# Patient Record
Sex: Male | Born: 1955 | Hispanic: Yes | Marital: Married | Smoking: Never smoker
Health system: Southern US, Community
[De-identification: ages and names within clinical notes are randomized; demographics above are authoritative.]

## PROBLEM LIST (undated history)

## (undated) DIAGNOSIS — E785 Hyperlipidemia, unspecified: Secondary | ICD-10-CM

## (undated) DIAGNOSIS — Z973 Presence of spectacles and contact lenses: Secondary | ICD-10-CM

## (undated) DIAGNOSIS — E119 Type 2 diabetes mellitus without complications: Secondary | ICD-10-CM

## (undated) HISTORY — DX: Hyperlipidemia, unspecified: E78.5

## (undated) HISTORY — PX: WISDOM TOOTH EXTRACTION: SHX21

## (undated) HISTORY — DX: Type 2 diabetes mellitus without complications: E11.9

---

## 1998-04-20 HISTORY — PX: MOUTH SURGERY: SHX715

## 2000-10-20 ENCOUNTER — Inpatient Hospital Stay (HOSPITAL_COMMUNITY): Admission: EM | Admit: 2000-10-20 | Discharge: 2000-10-21 | Payer: Self-pay | Admitting: Emergency Medicine

## 2000-10-20 ENCOUNTER — Encounter: Payer: Self-pay | Admitting: Emergency Medicine

## 2014-07-04 ENCOUNTER — Encounter (HOSPITAL_BASED_OUTPATIENT_CLINIC_OR_DEPARTMENT_OTHER): Payer: Self-pay | Admitting: *Deleted

## 2014-07-04 NOTE — Progress Notes (Signed)
No PCP-no cardiac or resp problems

## 2014-07-08 ENCOUNTER — Encounter (HOSPITAL_BASED_OUTPATIENT_CLINIC_OR_DEPARTMENT_OTHER): Payer: Self-pay | Admitting: Surgery

## 2014-07-08 ENCOUNTER — Ambulatory Visit: Payer: Self-pay | Admitting: Surgery

## 2014-07-08 DIAGNOSIS — K436 Other and unspecified ventral hernia with obstruction, without gangrene: Secondary | ICD-10-CM | POA: Diagnosis present

## 2014-07-08 NOTE — H&P (Signed)
General Surgery [facility]  Gary EinsteinLouis Stewart 06/27/2014 8:54 AM Location: [facility] Patient #: [id number] DOB: [birth year] Married / Language: English / Race: White Male  History of Present Illness Gary Stewart(Gary Stewart M. Gary Arora MD; 06/27/2014 9:22 AM) Patient words: hernia.  The patient is a 59 year old male who presents with an umbilical hernia. Patient is referred by Dr. Salli RealYun Stewart for evaluation of epigastric hernia. Patient first noted a bulge just above the level of the umbilicus approximately 1 month ago. Patient denies any pain. The bulge has become slightly larger. He denies any signs or symptoms of intestinal obstruction. He has had no prior abdominal surgery. He has never had a hernia repair. Patient discussed this with his friend, Dr. Colonel BaldGary Stewart, who recommended surgical repair. Patient presents today for evaluation.   Other Problems Fay Records(Gary Stewart, CMA; 06/27/2014 8:54 AM) Cancer  Past Surgical History Fay Records([place]; 06/27/2014 8:54 AM) Oral Surgery Vasectomy  Diagnostic Studies History Fay Records([place]; 06/27/2014 8:54 AM) Colonoscopy never  Allergies Fay Records(Gary Stewart, CMA; 06/27/2014 8:55 AM) Penicillin G Procaine *PENICILLINS*  Medication History Fay Records(Gary Stewart, CMA; 06/27/2014 8:55 AM) No Current Medications Medications Reconciled  Social History Fay Records(Gary Stewart, CMA; 06/27/2014 8:54 AM) Alcohol use Occasional alcohol use. Caffeine use Carbonated beverages, Coffee, Tea. No drug use Tobacco use Never smoker.  Family History Fay Records([place]; 06/27/2014 8:54 AM) Diabetes Mellitus Father.  Review of Systems Fay Records(Gary Stewart CMA; 06/27/2014 8:54 AM) General Not Present- Appetite Loss, Chills, Fatigue, Fever, Night Sweats, Weight Gain and Weight Loss. Skin Not Present- Change in Wart/Mole, Dryness, Hives, Jaundice, New Lesions, Non-Healing Wounds, Rash and Ulcer. HEENT Present- Wears glasses/contact lenses. Not Present- Earache, Hearing Loss, Hoarseness,  Nose Bleed, Oral Ulcers, Ringing in the Ears, Seasonal Allergies, Sinus Pain, Sore Throat, Visual Disturbances and Yellow Eyes. Respiratory Not Present- Bloody sputum, Chronic Cough, Difficulty Breathing, Snoring and Wheezing. Breast Not Present- Breast Mass, Breast Pain, Nipple Discharge and Skin Changes. Cardiovascular Not Present- Chest Pain, Difficulty Breathing Lying Down, Leg Cramps, Palpitations, Rapid Heart Rate, Shortness of Breath and Swelling of Extremities. Gastrointestinal Not Present- Abdominal Pain, Bloating, Bloody Stool, Change in Bowel Habits, Chronic diarrhea, Constipation, Difficulty Swallowing, Excessive gas, Gets full quickly at meals, Hemorrhoids, Indigestion, Nausea, Rectal Pain and Vomiting. Male Genitourinary Not Present- Blood in Urine, Change in Urinary Stream, Frequency, Impotence, Nocturia, Painful Urination, Urgency and Urine Leakage. Musculoskeletal Not Present- Back Pain, Joint Pain, Joint Stiffness, Muscle Pain, Muscle Weakness and Swelling of Extremities. Neurological Not Present- Decreased Memory, Fainting, Headaches, Numbness, Seizures, Tingling, Tremor, Trouble walking and Weakness. Psychiatric Not Present- Anxiety, Bipolar, Change in Sleep Pattern, Depression, Fearful and Frequent crying. Endocrine Not Present- Cold Intolerance, Excessive Hunger, Hair Changes, Heat Intolerance, Hot flashes and New Diabetes. Hematology Not Present- Easy Bruising, Excessive bleeding, Gland problems, HIV and Persistent Infections.   Vitals Fay Records(Gary Stewart CMA; 06/27/2014 8:56 AM) 06/27/2014 8:55 AM Weight: 203 lb Height: 72in Body Surface Area: 2.16 m Body Mass Index: 27.53 kg/m Temp.: 96.27F(Temporal)  Pulse: 85 (Regular)  Resp.: 17 (Unlabored)  BP: 132/68 (Sitting, Left Arm, Standard)    Physical Exam Gary Stewart(Gary Stewart M. Gary Peckham MD; 06/27/2014 9:22 AM)  General - appears comfortable, no distress; not diaphorectic  HEENT - normocephalic; sclerae clear, gaze conjugate; mucous  membranes moist, dentition good; voice normal  Neck - symmetric on extension; no palpable anterior or posterior cervical adenopathy; no palpable masses in the thyroid bed  Chest - clear bilaterally with rhonchi, rales, or wheeze  Cor - regular rhythm  with normal rate; no significant murmur  Abd - soft without distension; visible bulge both in the standing and recumbent position just above the level of the umbilicus; umbilicus appears normal; bulge measures approximately 5-6 cm in diameter, is partially reducible, is mildly tender with palpation; it is difficult to assess the size of the fascial defect but I believe it is 2 cm or less  Ext - non-tender without significant edema or lymphedema  Neuro - grossly intact; no tremor    Assessment & Plan Gary Heckler MD; 06/27/2014 9:24 AM)  EPIGASTRIC HERNIA (553.29  K43.9)  Patient has an incarcerated epigastric hernia. This is relatively asymptomatic.  Patient denied discuss surgical repair. I have recommended open repair with mesh patch. I have provided him with written literature to review at home. We have discussed performing the procedure as an outpatient surgical operation. We discussed restrictions on his activities following the surgery. We discussed the risk of recurrence being less than 5%. Patient understands and wishes to proceed in the near future. We will make arrangements for surgery at a time convenient for the patient.  The risks and benefits of the procedure have been discussed at length with the patient. The patient understands the proposed procedure, potential alternative treatments, and the course of recovery to be expected. All of the patient's questions have been answered at this time. The patient wishes to proceed with surgery.  Gary Heckler, MD, [facility] Office: [phone]

## 2014-07-10 ENCOUNTER — Ambulatory Visit (HOSPITAL_BASED_OUTPATIENT_CLINIC_OR_DEPARTMENT_OTHER)
Admission: RE | Admit: 2014-07-10 | Discharge: 2014-07-10 | Disposition: A | Discharge status: Home / Self Care | Payer: BLUE CROSS/BLUE SHIELD | Source: Ambulatory Visit | Attending: Surgery | Admitting: Surgery

## 2014-07-10 ENCOUNTER — Ambulatory Visit (HOSPITAL_BASED_OUTPATIENT_CLINIC_OR_DEPARTMENT_OTHER): Payer: BLUE CROSS/BLUE SHIELD | Admitting: Certified Registered"

## 2014-07-10 ENCOUNTER — Encounter (HOSPITAL_BASED_OUTPATIENT_CLINIC_OR_DEPARTMENT_OTHER)
Admission: RE | Disposition: A | Discharge status: Home / Self Care | Payer: Self-pay | Source: Ambulatory Visit | Attending: Surgery

## 2014-07-10 ENCOUNTER — Encounter (HOSPITAL_BASED_OUTPATIENT_CLINIC_OR_DEPARTMENT_OTHER): Payer: Self-pay | Admitting: *Deleted

## 2014-07-10 DIAGNOSIS — F1099 Alcohol use, unspecified with unspecified alcohol-induced disorder: Secondary | ICD-10-CM | POA: Insufficient documentation

## 2014-07-10 DIAGNOSIS — K436 Other and unspecified ventral hernia with obstruction, without gangrene: Secondary | ICD-10-CM | POA: Diagnosis not present

## 2014-07-10 DIAGNOSIS — Z880 Allergy status to penicillin: Secondary | ICD-10-CM | POA: Insufficient documentation

## 2014-07-10 HISTORY — DX: Presence of spectacles and contact lenses: Z97.3

## 2014-07-10 HISTORY — PX: EPIGASTRIC HERNIA REPAIR: SHX404

## 2014-07-10 LAB — POCT HEMOGLOBIN-HEMACUE: Hemoglobin: 16.6 g/dL (ref 13.0–17.0)

## 2014-07-10 SURGERY — REPAIR, HERNIA, EPIGASTRIC, ADULT
Anesthesia: General | Site: Abdomen

## 2014-07-10 MED ORDER — FENTANYL CITRATE 0.05 MG/ML IJ SOLN
INTRAMUSCULAR | Status: DC | PRN
  Administered 2014-07-10: 50 ug via INTRAVENOUS
  Administered 2014-07-10: 100 ug via INTRAVENOUS

## 2014-07-10 MED ORDER — SUCCINYLCHOLINE CHLORIDE 20 MG/ML IJ SOLN
INTRAMUSCULAR | Status: AC
  Filled 2014-07-10: qty 1

## 2014-07-10 MED ORDER — OXYCODONE HCL 5 MG/5ML PO SOLN: 5.0000 mg | Freq: Once | ORAL | Status: AC | PRN

## 2014-07-10 MED ORDER — LIDOCAINE HCL (CARDIAC) 20 MG/ML IV SOLN
INTRAVENOUS | Status: DC | PRN
  Administered 2014-07-10: 80 mg via INTRAVENOUS

## 2014-07-10 MED ORDER — OXYCODONE HCL 5 MG PO TABS
5.0000 mg | ORAL_TABLET | Freq: Once | ORAL | Status: AC | PRN
  Administered 2014-07-10: 5 mg via ORAL

## 2014-07-10 MED ORDER — DEXAMETHASONE SODIUM PHOSPHATE 4 MG/ML IJ SOLN
INTRAMUSCULAR | Status: DC | PRN
  Administered 2014-07-10: 10 mg via INTRAVENOUS

## 2014-07-10 MED ORDER — FENTANYL CITRATE 0.05 MG/ML IJ SOLN
INTRAMUSCULAR | Status: AC
  Filled 2014-07-10: qty 6

## 2014-07-10 MED ORDER — LACTATED RINGERS IV SOLN
INTRAVENOUS | Status: DC
  Administered 2014-07-10 (×2): via INTRAVENOUS

## 2014-07-10 MED ORDER — EPHEDRINE SULFATE 50 MG/ML IJ SOLN
INTRAMUSCULAR | Status: DC | PRN
  Administered 2014-07-10 (×2): 5 mg via INTRAVENOUS

## 2014-07-10 MED ORDER — BUPIVACAINE HCL (PF) 0.5 % IJ SOLN
INTRAMUSCULAR | Status: AC
  Filled 2014-07-10: qty 30

## 2014-07-10 MED ORDER — BUPIVACAINE HCL (PF) 0.5 % IJ SOLN
INTRAMUSCULAR | Status: DC | PRN
  Administered 2014-07-10: 30 mL

## 2014-07-10 MED ORDER — PROPOFOL 10 MG/ML IV EMUL
INTRAVENOUS | Status: AC
  Filled 2014-07-10: qty 50

## 2014-07-10 MED ORDER — PROPOFOL 10 MG/ML IV BOLUS
INTRAVENOUS | Status: DC | PRN
  Administered 2014-07-10: 300 mg via INTRAVENOUS

## 2014-07-10 MED ORDER — FENTANYL CITRATE 0.05 MG/ML IJ SOLN: 50.0000 ug | INTRAMUSCULAR | Status: DC | PRN

## 2014-07-10 MED ORDER — CIPROFLOXACIN IN D5W 400 MG/200ML IV SOLN
INTRAVENOUS | Status: AC
  Filled 2014-07-10: qty 200

## 2014-07-10 MED ORDER — MIDAZOLAM HCL 2 MG/2ML IJ SOLN
INTRAMUSCULAR | Status: AC
  Filled 2014-07-10: qty 2

## 2014-07-10 MED ORDER — MIDAZOLAM HCL 2 MG/2ML IJ SOLN: 1.0000 mg | INTRAMUSCULAR | Status: DC | PRN

## 2014-07-10 MED ORDER — CIPROFLOXACIN IN D5W 400 MG/200ML IV SOLN
400.0000 mg | INTRAVENOUS | Status: AC
  Administered 2014-07-10: 400 mg via INTRAVENOUS

## 2014-07-10 MED ORDER — MIDAZOLAM HCL 5 MG/5ML IJ SOLN
INTRAMUSCULAR | Status: DC | PRN
  Administered 2014-07-10: 2 mg via INTRAVENOUS

## 2014-07-10 MED ORDER — HYDROMORPHONE HCL 1 MG/ML IJ SOLN: 0.2500 mg | INTRAMUSCULAR | Status: DC | PRN

## 2014-07-10 MED ORDER — OXYCODONE HCL 5 MG PO TABS
ORAL_TABLET | ORAL | Status: AC
  Filled 2014-07-10: qty 1

## 2014-07-10 MED ORDER — OXYCODONE HCL 5 MG PO TABS: 5.0000 mg | ORAL_TABLET | ORAL | Status: DC | PRN

## 2014-07-10 MED ORDER — ONDANSETRON HCL 4 MG/2ML IJ SOLN
INTRAMUSCULAR | Status: DC | PRN
Start: 2014-07-10 — End: 2014-07-10
  Administered 2014-07-10: 4 mg via INTRAVENOUS

## 2014-07-10 MED ORDER — ONDANSETRON HCL 4 MG/2ML IJ SOLN: 4.0000 mg | Freq: Once | INTRAMUSCULAR | Status: DC | PRN

## 2014-07-10 SURGICAL SUPPLY — 37 items
BENZOIN TINCTURE PRP APPL 2/3 (GAUZE/BANDAGES/DRESSINGS) ×2 IMPLANT
BLADE CLIPPER SURG (BLADE) IMPLANT
BLADE SURG 15 STRL LF DISP TIS (BLADE) ×1 IMPLANT
BLADE SURG 15 STRL SS (BLADE) ×1
CHLORAPREP W/TINT 26ML (MISCELLANEOUS) ×2 IMPLANT
COTTONBALL LRG STERILE PKG (GAUZE/BANDAGES/DRESSINGS) ×2 IMPLANT
COVER BACK TABLE 60X90IN (DRAPES) ×2 IMPLANT
COVER MAYO STAND STRL (DRAPES) ×2 IMPLANT
DECANTER SPIKE VIAL GLASS SM (MISCELLANEOUS) ×2 IMPLANT
DRAPE LAPAROTOMY 100X72 PEDS (DRAPES) ×2 IMPLANT
DRAPE UTILITY XL STRL (DRAPES) ×2 IMPLANT
ELECT REM PT RETURN 9FT ADLT (ELECTROSURGICAL) ×2
ELECTRODE REM PT RTRN 9FT ADLT (ELECTROSURGICAL) ×1 IMPLANT
GAUZE SPONGE 4X4 16PLY XRAY LF (GAUZE/BANDAGES/DRESSINGS) IMPLANT
GLOVE BIO SURGEON STRL SZ 6.5 (GLOVE) ×2 IMPLANT
GLOVE BIOGEL PI IND STRL 7.0 (GLOVE) ×2 IMPLANT
GLOVE BIOGEL PI INDICATOR 7.0 (GLOVE) ×2
GLOVE SURG ORTHO 8.0 STRL STRW (GLOVE) ×2 IMPLANT
GOWN STRL REUS W/ TWL LRG LVL3 (GOWN DISPOSABLE) ×1 IMPLANT
GOWN STRL REUS W/ TWL XL LVL3 (GOWN DISPOSABLE) ×1 IMPLANT
GOWN STRL REUS W/TWL LRG LVL3 (GOWN DISPOSABLE) ×1
GOWN STRL REUS W/TWL XL LVL3 (GOWN DISPOSABLE) ×1
MESH VENTRALEX ST 2.5 CRC MED (Mesh General) ×2 IMPLANT
NEEDLE HYPO 25X1 1.5 SAFETY (NEEDLE) ×2 IMPLANT
NS IRRIG 1000ML POUR BTL (IV SOLUTION) ×2 IMPLANT
PACK BASIN DAY SURGERY FS (CUSTOM PROCEDURE TRAY) ×2 IMPLANT
PENCIL BUTTON HOLSTER BLD 10FT (ELECTRODE) ×2 IMPLANT
SLEEVE SCD COMPRESS KNEE MED (MISCELLANEOUS) ×2 IMPLANT
STRIP CLOSURE SKIN 1/2X4 (GAUZE/BANDAGES/DRESSINGS) ×2 IMPLANT
SUT ETHIBOND 0 MO6 C/R (SUTURE) IMPLANT
SUT MNCRL AB 4-0 PS2 18 (SUTURE) ×2 IMPLANT
SUT VICRYL 3-0 CR8 SH (SUTURE) ×2 IMPLANT
SUT VICRYL 4-0 PS2 18IN ABS (SUTURE) IMPLANT
SYR BULB 3OZ (MISCELLANEOUS) IMPLANT
SYR CONTROL 10ML LL (SYRINGE) ×2 IMPLANT
TOWEL OR 17X24 6PK STRL BLUE (TOWEL DISPOSABLE) ×4 IMPLANT
TOWEL OR NON WOVEN STRL DISP B (DISPOSABLE) ×2 IMPLANT

## 2014-07-10 NOTE — Op Note (Signed)
NAMKarlene Stewart:  Blackwell, Phillips              ACCOUNT NO.:  [id number]  MEDICAL RECORD NO.:  [id number]  LOCATION:                                 FACILITY:  PHYSICIAN:  [name] [name], MD           DATE OF BIRTH:  DATE OF PROCEDURE:  07/10/2014                              OPERATIVE REPORT   PREOPERATIVE DIAGNOSIS:  Incarcerated epigastric hernia.  POSTOPERATIVE DIAGNOSIS:  Incarcerated epigastric hernia.  PROCEDURE:  Repair of incarcerated epigastric hernia with mesh patch.  SURGEON:  [name] [name], MD, FACS  ANESTHESIA:  General.  ESTIMATED BLOOD LOSS:  Minimal.  PREPARATION:  ChloraPrep.  COMPLICATIONS:  None.  INDICATIONS:  The patient is a 59 year old male with an incarcerated epigastric hernia.  This was just above the level of the umbilicus in the midline.  It is causing mild discomfort.  He has had no signs or symptoms of obstruction.  He now comes to Surgery for repair.  BODY OF REPORT:  Pro[facility] .  The patient was brought to the operating room, placed in supine position on the operating room table.  Following administration of general anesthesia, the patient was prepped and draped in the usual aseptic fashion.  After ascertaining that an adequate level of anesthesia have been achieved, a midline incision was made just above the level of the umbilicus.  Dissection was carried in the subcutaneous tissues.  A fatty tissue mass was identified.  It was dissected out.  It measures approximately 5-6 cm in diameter.  It was dissected out of the subcutaneous tissues down to the fascial plane.  Fascial defect measures approximately 3 cm in diameter.  The mass was freed circumferentially and reduced.  Preperitoneal space was developed bluntly.  A Bard Ventralex ST mesh patch measuring 6.4 cm in diameter was selected and prepared.  It was inserted into the preperitoneal space and deployed circumferentially.  It was secured  circumferentially to the fascial edges with interrupted 0 Ethibond sutures.  Local field block was placed with Marcaine.  Subcutaneous tissues were closed with interrupted 3-0 Vicryl sutures.  Skin was closed with a running 4- 0 Monocryl subcuticular suture.  Skin was anesthetized with local anesthetic.  Wound was dressed with topical Dermabond.  The patient was awakened from anesthesia and brought to the recovery room.  The patient tolerated the procedure well.   [name], MD, [facility] Office: [phone]    TMG/MEDQ  D:  07/10/2014  T:  07/10/2014  Job:  [id number]  cc:   Dr. Charyl DancerYun

## 2014-07-10 NOTE — Anesthesia Procedure Notes (Signed)
Procedure Name: LMA Insertion Date/Time: 07/10/2014 7:21 AM Performed by: Curly ShoresRAFT, Cadence Minton W Pre-anesthesia Checklist: Patient identified, Emergency Drugs available, Suction available and Patient being monitored Patient Re-evaluated:Patient Re-evaluated prior to inductionOxygen Delivery Method: Circle System Utilized Preoxygenation: Pre-oxygenation with 100% oxygen Intubation Type: IV induction Ventilation: Mask ventilation without difficulty LMA: LMA inserted LMA Size: 5.0 Number of attempts: 1 Airway Equipment and Method: Bite block Placement Confirmation: positive ETCO2 and breath sounds checked- equal and bilateral Tube secured with: Tape Dental Injury: Teeth and Oropharynx as per pre-operative assessment

## 2014-07-10 NOTE — Discharge Instructions (Signed)

## 2014-07-10 NOTE — Anesthesia Preprocedure Evaluation (Signed)
Anesthesia Evaluation  Patient identified by MRN, date of birth, ID band Patient awake    Reviewed: Allergy & Precautions, NPO status , Patient's Chart, lab work & pertinent test results  Airway Mallampati: I  TM Distance: >3 FB Neck ROM: Full    Dental  (+) Teeth Intact, Partial Lower, Dental Advisory Given   Pulmonary  breath sounds clear to auscultation        Cardiovascular Rhythm:Regular Rate:Normal     Neuro/Psych    GI/Hepatic   Endo/Other    Renal/GU      Musculoskeletal   Abdominal   Peds  Hematology   Anesthesia Other Findings   Reproductive/Obstetrics                             Anesthesia Physical Anesthesia Plan  ASA: I  Anesthesia Plan: General   Post-op Pain Management:    Induction: Intravenous  Airway Management Planned: LMA  Additional Equipment:   Intra-op Plan:   Post-operative Plan: Extubation in OR  Informed Consent: I have reviewed the patients History and Physical, chart, labs and discussed the procedure including the risks, benefits and alternatives for the proposed anesthesia with the patient or authorized representative who has indicated his/her understanding and acceptance.   Dental advisory given  Plan Discussed with: CRNA, Anesthesiologist and Surgeon  Anesthesia Plan Comments:         Anesthesia Quick Evaluation

## 2014-07-10 NOTE — Anesthesia Postprocedure Evaluation (Signed)
  Anesthesia Post-op Note  Patient: Gary Stewart  Procedure(s) Performed: Procedure(s): INCARCERATED EPIGASTRIC HERNIA REPAIR WITH MESH (N/A)  Patient Location: PACU  Anesthesia Type: General   Level of Consciousness: awake, alert  and oriented  Airway and Oxygen Therapy: Patient Spontanous Breathing  Post-op Pain: none  Post-op Assessment: Post-op Vital signs reviewed  Post-op Vital Signs: Reviewed  Last Vitals:  Filed Vitals:   07/10/14 0921  BP: 141/81  Pulse: 71  Temp: 36.2 C  Resp: 20    Complications: No apparent anesthesia complications

## 2014-07-10 NOTE — Interval H&P Note (Signed)
History and Physical Interval Note:  07/10/2014 7:10 AM  Karlene EinsteinLouis Goodwine  has presented today for surgery, with the diagnosis of INCARCERATED EPIGASTRIC HERNIA.   The various methods of treatment have been discussed with the patient and family. After consideration of risks, benefits and other options for treatment, the patient has consented to    Procedure(s): INCARCERATED EPIGASTRIC HERNIA REPAIR WITH MESH (N/A) as a surgical intervention .    The patient's history has been reviewed, patient examined, no change in status, stable for surgery.  I have reviewed the patient's chart and labs.  Questions were answered to the patient's satisfaction.    [name]. [place] Noori, MD, [facility] Office: [phone]    Ember Henrikson MJudie Petit

## 2014-07-10 NOTE — Brief Op Note (Signed)
07/10/2014  8:13 AM  PATIENT:  Gary Stewart  59 y.o. male  PRE-OPERATIVE DIAGNOSIS:  INCARCERATED EPIGASTRIC HERNIA   POST-OPERATIVE DIAGNOSIS:  incarcerated epigastric hernia  PROCEDURE:  Procedure(s): INCARCERATED EPIGASTRIC HERNIA REPAIR WITH MESH (N/A)  SURGEON:  Surgeon(s) and Role:    * Darnell Levelodd Anel Creighton, MD - Primary  ANESTHESIA:   general  EBL:  Total I/O In: 1300 [I.V.:1300] Out: -   BLOOD ADMINISTERED:none  DRAINS: none   LOCAL MEDICATIONS USED:  MARCAINE     SPECIMEN:  No Specimen  DISPOSITION OF SPECIMEN:  N/A  COUNTS:  YES  TOURNIQUET:  * No tourniquets in log *  DICTATION: .Other Dictation: Dictation Number [id number]  PLAN OF CARE: Discharge to home after PACU  PATIENT DISPOSITION:  PACU - hemodynamically stable.   Delay start of Pharmacological VTE agent (>24hrs) due to surgical blood loss or risk of bleeding: yes  [name]. Neiva Maenza, MD, [facility] Office: [phone]

## 2014-07-10 NOTE — Transfer of Care (Signed)
Immediate Anesthesia Transfer of Care Note  Patient: Gary EinsteinLouis Stewart  Procedure(s) Performed: Procedure(s): INCARCERATED EPIGASTRIC HERNIA REPAIR WITH MESH (N/A)  Patient Location: PACU  Anesthesia Type:General  Level of Consciousness: awake, sedated and responds to stimulation  Airway & Oxygen Therapy: Patient Spontanous Breathing and Patient connected to face mask oxygen  Post-op Assessment: Report given to RN, Post -op Vital signs reviewed and stable and Patient moving all extremities  Post vital signs: Reviewed and stable  Last Vitals:  Filed Vitals:   07/10/14 0627  BP: 156/91  Pulse: 79  Temp: 36.7 C  Resp: 16    Complications: No apparent anesthesia complications

## 2014-07-11 ENCOUNTER — Encounter (HOSPITAL_BASED_OUTPATIENT_CLINIC_OR_DEPARTMENT_OTHER): Payer: Self-pay | Admitting: Surgery

## 2018-01-14 DIAGNOSIS — Z23 Encounter for immunization: Secondary | ICD-10-CM | POA: Diagnosis not present

## 2019-04-25 ENCOUNTER — Inpatient Hospital Stay (HOSPITAL_COMMUNITY)
Admission: EM | Admit: 2019-04-25 | Discharge: 2019-04-27 | DRG: 639 | Disposition: A | Discharge status: Home / Self Care | Payer: BC Managed Care – PPO | Attending: Family Medicine | Admitting: Family Medicine

## 2019-04-25 ENCOUNTER — Encounter (HOSPITAL_COMMUNITY): Payer: Self-pay

## 2019-04-25 DIAGNOSIS — Z6820 Body mass index (BMI) 20.0-20.9, adult: Secondary | ICD-10-CM | POA: Diagnosis not present

## 2019-04-25 DIAGNOSIS — R634 Abnormal weight loss: Secondary | ICD-10-CM | POA: Diagnosis not present

## 2019-04-25 DIAGNOSIS — Z85831 Personal history of malignant neoplasm of soft tissue: Secondary | ICD-10-CM | POA: Diagnosis not present

## 2019-04-25 DIAGNOSIS — E1110 Type 2 diabetes mellitus with ketoacidosis without coma: Principal | ICD-10-CM | POA: Diagnosis present

## 2019-04-25 DIAGNOSIS — Z820 Family history of epilepsy and other diseases of the nervous system: Secondary | ICD-10-CM | POA: Diagnosis not present

## 2019-04-25 DIAGNOSIS — Z20822 Contact with and (suspected) exposure to covid-19: Secondary | ICD-10-CM | POA: Diagnosis not present

## 2019-04-25 DIAGNOSIS — R358 Other polyuria: Secondary | ICD-10-CM | POA: Diagnosis not present

## 2019-04-25 DIAGNOSIS — K5730 Diverticulosis of large intestine without perforation or abscess without bleeding: Secondary | ICD-10-CM | POA: Diagnosis not present

## 2019-04-25 DIAGNOSIS — E1165 Type 2 diabetes mellitus with hyperglycemia: Secondary | ICD-10-CM

## 2019-04-25 DIAGNOSIS — M21372 Foot drop, left foot: Secondary | ICD-10-CM

## 2019-04-25 DIAGNOSIS — E1100 Type 2 diabetes mellitus with hyperosmolarity without nonketotic hyperglycemic-hyperosmolar coma (NKHHC): Secondary | ICD-10-CM

## 2019-04-25 DIAGNOSIS — Z880 Allergy status to penicillin: Secondary | ICD-10-CM

## 2019-04-25 DIAGNOSIS — Z03818 Encounter for observation for suspected exposure to other biological agents ruled out: Secondary | ICD-10-CM | POA: Diagnosis not present

## 2019-04-25 DIAGNOSIS — M7989 Other specified soft tissue disorders: Secondary | ICD-10-CM | POA: Diagnosis present

## 2019-04-25 DIAGNOSIS — R432 Parageusia: Secondary | ICD-10-CM | POA: Diagnosis not present

## 2019-04-25 DIAGNOSIS — R739 Hyperglycemia, unspecified: Secondary | ICD-10-CM | POA: Diagnosis not present

## 2019-04-25 LAB — BASIC METABOLIC PANEL
Anion gap: 20 — ABNORMAL HIGH (ref 5–15)
BUN: 17 mg/dL (ref 8–23)
CO2: 19 mmol/L — ABNORMAL LOW (ref 22–32)
Calcium: 9.1 mg/dL (ref 8.9–10.3)
Chloride: 79 mmol/L — ABNORMAL LOW (ref 98–111)
Creatinine, Ser: 1.21 mg/dL (ref 0.61–1.24)
GFR calc Af Amer: >60 mL/min (ref >60)
GFR calc non Af Amer: >60 mL/min (ref >60)
Glucose, Bld: 1076 mg/dL (ref 70–99)
Potassium: 5 mmol/L (ref 3.5–5.1)
Sodium: 118 mmol/L — CL (ref 135–145)

## 2019-04-25 LAB — URINALYSIS, ROUTINE W REFLEX MICROSCOPIC
Bacteria, UA: NONE SEEN
Bilirubin Urine: NEGATIVE
Glucose, UA: >=500 mg/dL — AB
Hgb urine dipstick: NEGATIVE
Ketones, ur: 20 mg/dL — AB
Leukocytes,Ua: NEGATIVE
Nitrite: NEGATIVE
Protein, ur: NEGATIVE mg/dL
Specific Gravity, Urine: 1.025 (ref 1.005–1.030)
pH: 5 (ref 5.0–8.0)

## 2019-04-25 LAB — CBG MONITORING, ED: Glucose-Capillary: >600 mg/dL (ref 70–99)

## 2019-04-25 LAB — CBC
HCT: 40.2 % (ref 39.0–52.0)
Hemoglobin: 13.5 g/dL (ref 13.0–17.0)
MCH: 32.9 pg (ref 26.0–34.0)
MCHC: 33.6 g/dL (ref 30.0–36.0)
MCV: 98 fL (ref 80.0–100.0)
Platelets: 225 10*3/uL (ref 150–400)
RBC: 4.1 MIL/uL — ABNORMAL LOW (ref 4.22–5.81)
RDW: 12.3 % (ref 11.5–15.5)
WBC: 7.5 10*3/uL (ref 4.0–10.5)
nRBC: 0 % (ref 0.0–0.2)

## 2019-04-25 NOTE — ED Triage Notes (Signed)
Pt sent here by doctor for abnormal lab work, hyperglycemia in DKA

## 2019-04-25 NOTE — ED Notes (Signed)
CBG Results of HI >600 reported to [place], Charity fundraiser.

## 2019-04-26 ENCOUNTER — Inpatient Hospital Stay (HOSPITAL_COMMUNITY): Payer: BC Managed Care – PPO

## 2019-04-26 ENCOUNTER — Encounter (HOSPITAL_COMMUNITY): Payer: Self-pay | Admitting: Internal Medicine

## 2019-04-26 DIAGNOSIS — R634 Abnormal weight loss: Secondary | ICD-10-CM | POA: Diagnosis present

## 2019-04-26 DIAGNOSIS — E1165 Type 2 diabetes mellitus with hyperglycemia: Secondary | ICD-10-CM | POA: Diagnosis not present

## 2019-04-26 DIAGNOSIS — Z85831 Personal history of malignant neoplasm of soft tissue: Secondary | ICD-10-CM | POA: Diagnosis not present

## 2019-04-26 DIAGNOSIS — E1110 Type 2 diabetes mellitus with ketoacidosis without coma: Secondary | ICD-10-CM | POA: Diagnosis present

## 2019-04-26 DIAGNOSIS — Z6820 Body mass index (BMI) 20.0-20.9, adult: Secondary | ICD-10-CM | POA: Diagnosis not present

## 2019-04-26 DIAGNOSIS — Z880 Allergy status to penicillin: Secondary | ICD-10-CM | POA: Diagnosis not present

## 2019-04-26 DIAGNOSIS — E1100 Type 2 diabetes mellitus with hyperosmolarity without nonketotic hyperglycemic-hyperosmolar coma (NKHHC): Secondary | ICD-10-CM | POA: Diagnosis not present

## 2019-04-26 DIAGNOSIS — M21372 Foot drop, left foot: Secondary | ICD-10-CM | POA: Diagnosis present

## 2019-04-26 DIAGNOSIS — Z820 Family history of epilepsy and other diseases of the nervous system: Secondary | ICD-10-CM | POA: Diagnosis not present

## 2019-04-26 DIAGNOSIS — Z20822 Contact with and (suspected) exposure to covid-19: Secondary | ICD-10-CM | POA: Diagnosis present

## 2019-04-26 DIAGNOSIS — M7989 Other specified soft tissue disorders: Secondary | ICD-10-CM | POA: Diagnosis present

## 2019-04-26 LAB — HEPATIC FUNCTION PANEL
ALT: 32 U/L (ref 0–44)
AST: 20 U/L (ref 15–41)
Albumin: 2.8 g/dL — ABNORMAL LOW (ref 3.5–5.0)
Alkaline Phosphatase: 49 U/L (ref 38–126)
Bilirubin, Direct: 0.1 mg/dL (ref 0.0–0.2)
Indirect Bilirubin: 0.9 mg/dL (ref 0.3–0.9)
Total Bilirubin: 1 mg/dL (ref 0.3–1.2)
Total Protein: 5.2 g/dL — ABNORMAL LOW (ref 6.5–8.1)

## 2019-04-26 LAB — POCT I-STAT EG7
Acid-base deficit: 3 mmol/L — ABNORMAL HIGH (ref 0.0–2.0)
Bicarbonate: 21.6 mmol/L (ref 20.0–28.0)
Calcium, Ion: 1.11 mmol/L — ABNORMAL LOW (ref 1.15–1.40)
HCT: 46 % (ref 39.0–52.0)
Hemoglobin: 15.6 g/dL (ref 13.0–17.0)
O2 Saturation: 73 %
Potassium: 4.7 mmol/L (ref 3.5–5.1)
Sodium: 126 mmol/L — ABNORMAL LOW (ref 135–145)
TCO2: 23 mmol/L (ref 22–32)
pCO2, Ven: 36.3 mmHg — ABNORMAL LOW (ref 44.0–60.0)
pH, Ven: 7.381 (ref 7.250–7.430)
pO2, Ven: 39 mmHg (ref 32.0–45.0)

## 2019-04-26 LAB — BETA-HYDROXYBUTYRIC ACID
Beta-Hydroxybutyric Acid: 6.78 mmol/L — ABNORMAL HIGH (ref 0.05–0.27)
Beta-Hydroxybutyric Acid: 1.52 mmol/L — ABNORMAL HIGH (ref 0.05–0.27)
Beta-Hydroxybutyric Acid: 0.86 mmol/L — ABNORMAL HIGH (ref 0.05–0.27)

## 2019-04-26 LAB — BASIC METABOLIC PANEL
Anion gap: 24 — ABNORMAL HIGH (ref 5–15)
Anion gap: 15 (ref 5–15)
Anion gap: 12 (ref 5–15)
Anion gap: 11 (ref 5–15)
Anion gap: 10 (ref 5–15)
BUN: 16 mg/dL (ref 8–23)
BUN: 10 mg/dL (ref 8–23)
BUN: 9 mg/dL (ref 8–23)
BUN: 8 mg/dL (ref 8–23)
BUN: 9 mg/dL (ref 8–23)
CO2: 18 mmol/L — ABNORMAL LOW (ref 22–32)
CO2: 21 mmol/L — ABNORMAL LOW (ref 22–32)
CO2: 25 mmol/L (ref 22–32)
CO2: 25 mmol/L (ref 22–32)
CO2: 25 mmol/L (ref 22–32)
Calcium: 9.5 mg/dL (ref 8.9–10.3)
Calcium: 9 mg/dL (ref 8.9–10.3)
Calcium: 8.8 mg/dL — ABNORMAL LOW (ref 8.9–10.3)
Calcium: 8.5 mg/dL — ABNORMAL LOW (ref 8.9–10.3)
Calcium: 8.9 mg/dL (ref 8.9–10.3)
Chloride: 86 mmol/L — ABNORMAL LOW (ref 98–111)
Chloride: 99 mmol/L (ref 98–111)
Chloride: 101 mmol/L (ref 98–111)
Chloride: 101 mmol/L (ref 98–111)
Chloride: 103 mmol/L (ref 98–111)
Creatinine, Ser: 1.16 mg/dL (ref 0.61–1.24)
Creatinine, Ser: 0.99 mg/dL (ref 0.61–1.24)
Creatinine, Ser: 0.74 mg/dL (ref 0.61–1.24)
Creatinine, Ser: 0.67 mg/dL (ref 0.61–1.24)
Creatinine, Ser: 0.6 mg/dL — ABNORMAL LOW (ref 0.61–1.24)
GFR calc Af Amer: >60 mL/min (ref >60)
GFR calc Af Amer: >60 mL/min (ref >60)
GFR calc Af Amer: >60 mL/min (ref >60)
GFR calc Af Amer: >60 mL/min (ref >60)
GFR calc Af Amer: >60 mL/min (ref >60)
GFR calc non Af Amer: >60 mL/min (ref >60)
GFR calc non Af Amer: >60 mL/min (ref >60)
GFR calc non Af Amer: >60 mL/min (ref >60)
GFR calc non Af Amer: >60 mL/min (ref >60)
GFR calc non Af Amer: >60 mL/min (ref >60)
Glucose, Bld: 666 mg/dL (ref 70–99)
Glucose, Bld: 321 mg/dL — ABNORMAL HIGH (ref 70–99)
Glucose, Bld: 216 mg/dL — ABNORMAL HIGH (ref 70–99)
Glucose, Bld: 198 mg/dL — ABNORMAL HIGH (ref 70–99)
Glucose, Bld: 165 mg/dL — ABNORMAL HIGH (ref 70–99)
Potassium: 4.6 mmol/L (ref 3.5–5.1)
Potassium: 3.8 mmol/L (ref 3.5–5.1)
Potassium: 3.3 mmol/L — ABNORMAL LOW (ref 3.5–5.1)
Potassium: 3.3 mmol/L — ABNORMAL LOW (ref 3.5–5.1)
Potassium: 3.2 mmol/L — ABNORMAL LOW (ref 3.5–5.1)
Sodium: 128 mmol/L — ABNORMAL LOW (ref 135–145)
Sodium: 135 mmol/L (ref 135–145)
Sodium: 138 mmol/L (ref 135–145)
Sodium: 137 mmol/L (ref 135–145)
Sodium: 138 mmol/L (ref 135–145)

## 2019-04-26 LAB — CBG MONITORING, ED
Glucose-Capillary: 143 mg/dL — ABNORMAL HIGH (ref 70–99)
Glucose-Capillary: 144 mg/dL — ABNORMAL HIGH (ref 70–99)
Glucose-Capillary: 180 mg/dL — ABNORMAL HIGH (ref 70–99)
Glucose-Capillary: 190 mg/dL — ABNORMAL HIGH (ref 70–99)
Glucose-Capillary: 190 mg/dL — ABNORMAL HIGH (ref 70–99)
Glucose-Capillary: 200 mg/dL — ABNORMAL HIGH (ref 70–99)
Glucose-Capillary: 204 mg/dL — ABNORMAL HIGH (ref 70–99)
Glucose-Capillary: 206 mg/dL — ABNORMAL HIGH (ref 70–99)
Glucose-Capillary: 217 mg/dL — ABNORMAL HIGH (ref 70–99)
Glucose-Capillary: 223 mg/dL — ABNORMAL HIGH (ref 70–99)
Glucose-Capillary: 237 mg/dL — ABNORMAL HIGH (ref 70–99)
Glucose-Capillary: 253 mg/dL — ABNORMAL HIGH (ref 70–99)
Glucose-Capillary: 274 mg/dL — ABNORMAL HIGH (ref 70–99)
Glucose-Capillary: 277 mg/dL — ABNORMAL HIGH (ref 70–99)
Glucose-Capillary: 316 mg/dL — ABNORMAL HIGH (ref 70–99)
Glucose-Capillary: 363 mg/dL — ABNORMAL HIGH (ref 70–99)
Glucose-Capillary: 457 mg/dL — ABNORMAL HIGH (ref 70–99)
Glucose-Capillary: 533 mg/dL (ref 70–99)
Glucose-Capillary: >600 mg/dL (ref 70–99)

## 2019-04-26 LAB — TSH: TSH: 0.922 u[IU]/mL (ref 0.350–4.500)

## 2019-04-26 LAB — HIV ANTIBODY (ROUTINE TESTING W REFLEX): HIV Screen 4th Generation wRfx: NONREACTIVE

## 2019-04-26 LAB — SARS CORONAVIRUS 2 (TAT 6-24 HRS): SARS Coronavirus 2: NEGATIVE

## 2019-04-26 LAB — IRON AND TIBC
Iron: 65 ug/dL (ref 45–182)
Saturation Ratios: 29 % (ref 17.9–39.5)
TIBC: 224 ug/dL — ABNORMAL LOW (ref 250–450)
UIBC: 159 ug/dL

## 2019-04-26 LAB — FERRITIN: Ferritin: 985 ng/mL — ABNORMAL HIGH (ref 24–336)

## 2019-04-26 MED ORDER — ENOXAPARIN SODIUM 40 MG/0.4ML ~~LOC~~ SOLN
40.0000 mg | SUBCUTANEOUS | Status: DC
  Administered 2019-04-26 – 2019-04-27 (×2): 40 mg via SUBCUTANEOUS
  Filled 2019-04-26 (×3): qty 0.4

## 2019-04-26 MED ORDER — DEXTROSE 50 % IV SOLN: 0.0000 mL | INTRAVENOUS | Status: DC | PRN

## 2019-04-26 MED ORDER — DEXTROSE-NACL 5-0.45 % IV SOLN: INTRAVENOUS | Status: DC

## 2019-04-26 MED ORDER — INSULIN GLARGINE 100 UNIT/ML ~~LOC~~ SOLN
20.0000 [IU] | Freq: Every day | SUBCUTANEOUS | Status: DC
  Administered 2019-04-26: 19:00:00 20 [IU] via SUBCUTANEOUS
  Filled 2019-04-26 (×2): qty 0.2

## 2019-04-26 MED ORDER — LIVING WELL WITH DIABETES BOOK
Freq: Once | Status: DC
  Filled 2019-04-26: qty 1

## 2019-04-26 MED ORDER — INSULIN STARTER KIT- PEN NEEDLES (ENGLISH)
1.0000 | Freq: Once | Status: DC
  Filled 2019-04-26: qty 1

## 2019-04-26 MED ORDER — INSULIN REGULAR(HUMAN) IN NACL 100-0.9 UT/100ML-% IV SOLN
INTRAVENOUS | Status: DC
  Administered 2019-04-26: 03:00:00 4.8 [IU]/h via INTRAVENOUS
  Filled 2019-04-26: qty 100

## 2019-04-26 MED ORDER — SODIUM CHLORIDE 0.9 % IV SOLN: INTRAVENOUS | Status: DC

## 2019-04-26 MED ORDER — INSULIN REGULAR(HUMAN) IN NACL 100-0.9 UT/100ML-% IV SOLN: INTRAVENOUS | Status: DC

## 2019-04-26 MED ORDER — LACTATED RINGERS IV BOLUS
1000.0000 mL | INTRAVENOUS | Status: AC
  Administered 2019-04-26: 02:00:00 1000 mL via INTRAVENOUS

## 2019-04-26 MED ORDER — POTASSIUM CHLORIDE 10 MEQ/100ML IV SOLN
10.0000 meq | INTRAVENOUS | Status: AC
  Administered 2019-04-26 (×2): 10 meq via INTRAVENOUS
  Filled 2019-04-26 (×2): qty 100

## 2019-04-26 MED ORDER — IOHEXOL 300 MG/ML  SOLN
100.0000 mL | Freq: Once | INTRAMUSCULAR | Status: AC | PRN
  Administered 2019-04-26: 100 mL via INTRAVENOUS

## 2019-04-26 NOTE — ED Notes (Signed)
Pt ambulated to the bathroom. No distress noted.  

## 2019-04-26 NOTE — ED Provider Notes (Signed)
TIME SEEN: 12:49 AM  CHIEF COMPLAINT: Hyperglycemia  HPI: Patient is a 64 year old healthy male who presents to the emergency department with hyperglycemia.  States he was seen by his PCP Dr. Milta Deiters with [facility] physicians yesterday for routine physical.  Was called tonight when labs resulted showing hyperglycemia with DKA.  No known history of diabetes.  States he has been feeling fine without any symptoms.  No recent steroid use.  No fevers, cough, vomiting, diarrhea.  ROS: See HPI Constitutional: no fever  Eyes: no drainage  ENT: no runny nose   Cardiovascular:  no chest pain  Resp: no SOB  GI: no vomiting GU: no dysuria Integumentary: no rash  Allergy: no hives  Musculoskeletal: no leg swelling  Neurological: no slurred speech ROS otherwise negative  PAST MEDICAL HISTORY/PAST SURGICAL HISTORY:  Past Medical History:  Diagnosis Date  . Wears glasses     MEDICATIONS:  Prior to Admission medications   Medication Sig Start Date End Date Taking? Authorizing Provider  oxyCODONE (OXY IR/ROXICODONE) 5 MG immediate release tablet Take 1-2 tablets (5-10 mg total) by mouth every 4 (four) hours as needed for moderate pain. 07/10/14   Armandina Gemma, MD    ALLERGIES:  Allergies  Allergen Reactions  . Penicillins Rash    SOCIAL HISTORY:  Social History   Tobacco Use  . Smoking status: Never Smoker  Substance Use Topics  . Alcohol use: Yes    Comment: occ    FAMILY HISTORY: No family history on file.  EXAM: BP 135/75 (BP Location: Right Arm)   Pulse 77   Temp 98.5 F (36.9 C) (Oral)   Resp 16   SpO2 99%  CONSTITUTIONAL: Alert and oriented and responds appropriately to questions. Well-appearing; well-nourished HEAD: Normocephalic EYES: Conjunctivae clear, pupils appear equal, EOM appear intact ENT: normal nose; moist mucous membranes NECK: Supple, normal ROM CARD: RRR; S1 and S2 appreciated; no murmurs, no clicks, no rubs, no gallops RESP: Normal chest excursion without  splinting or tachypnea; breath sounds clear and equal bilaterally; no wheezes, no rhonchi, no rales, no hypoxia or respiratory distress, speaking full sentences ABD/GI: Normal bowel sounds; non-distended; soft, non-tender, no rebound, no guarding, no peritoneal signs, no hepatosplenomegaly BACK:  The back appears normal EXT: Normal ROM in all joints; no deformity noted, no edema; no cyanosis SKIN: Normal color for age and race; warm; no rash on exposed skin NEURO: Moves all extremities equally PSYCH: The patient's mood and manner are appropriate.   MEDICAL DECISION MAKING: Patient here with DKA.  New onset diabetes.  Will start insulin infusion and IV fluids.  Will discuss with medicine.  Potassium level is 5.0.  Will check VBG.  ED PROGRESS:    1:15 AM Discussed patient's case with hospitalist, Dr. Maudie Mercury.  I have recommended admission and patient (and family if present) agree with this plan. Admitting physician will place admission orders.   I reviewed all nursing notes, vitals, pertinent previous records and interpreted all EKGs, lab and urine results, imaging (as available).    CRITICAL CARE Performed by: Pryor Curia   Total critical care time: 40 minutes  Critical care time was exclusive of separately billable procedures and treating other patients.  Critical care was necessary to treat or prevent imminent or life-threatening deterioration.  Critical care was time spent personally by me on the following activities: development of treatment plan with patient and/or surrogate as well as nursing, discussions with consultants, evaluation of patient's response to treatment, examination of patient, obtaining history from  patient or surrogate, ordering and performing treatments and interventions, ordering and review of laboratory studies, ordering and review of radiographic studies, pulse oximetry and re-evaluation of patient's condition.   Nehemyah Foushee was evaluated in Emergency  Department on 04/26/2019 for the symptoms described in the history of present illness. He was evaluated in the context of the global COVID-19 pandemic, which necessitated consideration that the patient might be at risk for infection with the SARS-CoV-2 virus that causes COVID-19. Institutional protocols and algorithms that pertain to the evaluation of patients at risk for COVID-19 are in a state of rapid change based on information released by regulatory bodies including the CDC and federal and state organizations. These policies and algorithms were followed during the patient's care in the ED.  Patient was seen wearing N95, face shield, gloves.    Naoki Migliaccio, Layla Maw, DO 04/26/19 0129

## 2019-04-26 NOTE — ED Notes (Signed)
Dinner Tray Ordered @1652 .

## 2019-04-26 NOTE — Progress Notes (Signed)
Patient seen and examined at bedside, patient admitted after midnight, please see earlier detailed admission note by Pearson Grippe, MD. Briefly, patient presented secondary to an abnormal lab on PCP labwork and found to have evidence of HHS. Started on IV insulin drip.Called PCP's office and patient's hemoglobin A1C of >16.9%.  General exam: Appears calm and comfortable Respiratory system: Clear to auscultation. Respiratory effort normal. Cardiovascular system: S1 & S2 heard, RRR. No murmurs, rubs, gallops or clicks. Gastrointestinal system: Abdomen is nondistended, soft and nontender. No organomegaly or masses felt. Normal bowel sounds heard. Central nervous system: Alert and oriented. No focal neurological deficits. Extremities: 2+ LE edema. No calf tenderness. Left foot drop Skin: No cyanosis. No rashes Psychiatry: Judgement and insight appear normal. Mood & affect appropriate.   HHS -Will transition to subcutaneous insulin today (will obtain standing weight for accurate dosing) -Carb modified diet -Diabetes coordinator  Left foot drop Unknown etiology -PT eval  Bilateral leg swelling Appears to be chronic. No heart disease but patient does not have good medical follow-up. -Monitor; consider stockings. Patient denies neuropathy symptoms but would be hesitant to start stockings with such uncontrolled diabetes  Elevated ferritin Iron panel suggests chronic disease. No associated anemia -Hepatic function panel   Jacquelin Hawking, MD [facility] 04/26/2019, 9:53 AM

## 2019-04-26 NOTE — H&P (Signed)
TRH H&P    Patient Demographics:    Gary Stewart, is a 64 y.o. male  MRN: [id number]  DOB - May 26, 1955  Admit Date - 04/25/2019  Referring MD/NP/PA: Ward  Outpatient Primary MD for the patient is [place], L.Marlou Sa, MD  Patient coming from:  home  Chief complaint-   hyperglycemia   HPI:    Gary Stewart  is a 64 y.o. 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  In ED,  T 98.4, P 94 R 20, B p 153/91  Pox 99% on RA  Na 118, K 5.0,  Bun 17, Creatinine 1.21 Glucose 1,076,  AG 20, Hco3 19 Wbc 7.5, hgb 13.5, Plt 225  Pt will be admitted for hyperglycemia, hyperosmolar state and weight loss    Review of systems:    In addition to the HPI above,  No Fever-chills, No Headache, No changes with Vision or hearing, No problems swallowing food or Liquids, No Chest pain, Cough or Shortness of Breath, No Abdominal pain, No Nausea or Vomiting, bowel movements are regular, No Blood in stool or Urine, No dysuria, No new skin rashes or bruises, No new joints pains-aches,  No new weakness, tingling, numbness in any extremity,     No significant Mental Stressors.  All other systems reviewed and are negative.    Past History of the following :    Past Medical History:  Diagnosis Date  . Wears glasses       Past Surgical History:  Procedure Laterality Date  . EPIGASTRIC HERNIA REPAIR N/A 07/10/2014   Procedure: INCARCERATED EPIGASTRIC HERNIA REPAIR WITH MESH;  Surgeon: Armandina Gemma, MD;  Location: [facility];  Service: General;  Laterality: N/A;  . 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 WISDOM TOOTH EXTRACTION        Social History:      Social History   Tobacco Use  . Smoking status: Never Smoker  . Smokeless tobacco: Never Used  Substance Use Topics  . Alcohol use: Yes    Comment:  occ       Family History :     Family History  Problem Relation Age of Onset  . Parkinson's disease Father        Home Medications:   Prior to Admission medications   Medication Sig Start Date End Date Taking? Authorizing Provider  oxyCODONE (OXY IR/ROXICODONE) 5 MG immediate release tablet Take 1-2 tablets (5-10 mg total) by mouth every 4 (four) hours as needed for moderate pain. 07/10/14   Armandina Gemma, MD     Allergies:     Allergies  Allergen Reactions  . Penicillins Rash     Physical Exam:   Vitals  Blood pressure 132/76, pulse 69, temperature 98.5 F (36.9 C), temperature source Oral, resp. rate 13, SpO2 100 %.  1.  General: axoxo3  2. Psychiatric: euthymic  3. Neurologic: nonfocal  4. HEENMT:  Anicteric, pupils 1.64mm symmetric, direct, consensual,  near intact Neck: no jvd  5. Respiratory : rrr s1, s2, no m/g/r  6. Cardiovascular : rrr s1, s2, no m/g/r  7. Gastrointestinal:  Abd: soft, nt, nd, +bs  8. Skin:  Ext: no c/c/e,  Slight bronze tone to skin  9.Musculoskeletal:  Good ROM    Data Review:    CBC Recent Labs  Lab 04/25/19 2123 04/26/19 0102  WBC 7.5  --   HGB 13.5 15.6  HCT 40.2 46.0  PLT 225  --   MCV 98.0  --   MCH 32.9  --   MCHC 33.6  --   RDW 12.3  --    ------------------------------------------------------------------------------------------------------------------  Results for orders placed or performed during the hospital encounter of 04/25/19 (from the past 48 hour(s))  Urinalysis, Routine w reflex microscopic     Status: Abnormal   Collection Time: 04/25/19  9:17 PM  Result Value Ref Range   Color, Urine COLORLESS (A) YELLOW   APPearance CLEAR CLEAR   Specific Gravity, Urine 1.025 1.005 - 1.030   pH 5.0 5.0 - 8.0   Glucose, UA >=500 (A) NEGATIVE mg/dL   Hgb urine dipstick NEGATIVE NEGATIVE   Bilirubin Urine NEGATIVE NEGATIVE   Ketones, ur 20 (A) NEGATIVE mg/dL   Protein, ur NEGATIVE NEGATIVE mg/dL    Nitrite NEGATIVE NEGATIVE   Leukocytes,Ua NEGATIVE NEGATIVE   WBC, UA 0-5 0 - 5 WBC/hpf   Bacteria, UA NONE SEEN NONE SEEN   Mucus PRESENT     Comment: Performed at [facility], [address], [place], [place] [address]  Basic metabolic panel     Status: Abnormal   Collection Time: 04/25/19  9:23 PM  Result Value Ref Range   Sodium 118 (LL) 135 - 145 mmol/L    Comment: CRITICAL RESULT CALLED TO, READ BACK BY AND VERIFIED WITH: BAIN C,RN 04/25/19 2240 WAYK    Potassium 5.0 3.5 - 5.1 mmol/L   Chloride 79 (L) 98 - 111 mmol/L   CO2 19 (L) 22 - 32 mmol/L   Glucose, Bld 1,076 (HH) 70 - 99 mg/dL    Comment: CRITICAL RESULT CALLED TO, READ BACK BY AND VERIFIED WITH: BAIN C,RN 04/25/19 2240 WAYK    BUN 17 8 - 23 mg/dL   Creatinine, Ser 0.16 0.61 - 1.24 mg/dL   Calcium 9.1 8.9 - 01.0 mg/dL   GFR calc non Af Amer >60 >60 mL/min   GFR calc Af Amer >60 >60 mL/min   Anion gap 20 (H) 5 - 15    Comment: Performed at [facility], [address], [place], [place] [address]  CBC     Status: Abnormal   Collection Time: 04/25/19  9:23 PM  Result Value Ref Range   WBC 7.5 4.0 - 10.5 K/uL   RBC 4.10 (L) 4.22 - 5.81 MIL/uL   Hemoglobin 13.5 13.0 - 17.0 g/dL   HCT 57.3 22.0 - 25.4 %   MCV 98.0 80.0 - 100.0 fL   MCH 32.9 26.0 - 34.0 pg   MCHC 33.6 30.0 - 36.0 g/dL   RDW 27.0 62.3 - 76.2 %   Platelets 225 150 - 400 K/uL   nRBC 0.0 0.0 - 0.2 %    Comment: Performed at [facility], [address], [place], [place] [address]  CBG monitoring, ED     Status: Abnormal   Collection Time: 04/25/19  9:25 PM  Result Value Ref Range   Glucose-Capillary >600 (HH) 70 - 99 mg/dL  CBG monitoring, ED     Status: Abnormal   Collection Time: 04/26/19 12:45 AM  Result Value Ref Range   Glucose-Capillary >600 (HH) 70 - 99 mg/dL  POCT I-Stat EG7     Status: Abnormal   Collection Time: 04/26/19  1:02 AM  Result Value Ref Range   pH, Ven 7.381 7.250 - 7.430   pCO2, Ven 36.3 (L) 44.0 - 60.0  mmHg   pO2, Ven 39.0 32.0 - 45.0 mmHg   Bicarbonate 21.6 20.0 - 28.0 mmol/L   TCO2 23 22 - 32 mmol/L   O2 Saturation 73.0 %   Acid-base deficit 3.0 (H) 0.0 - 2.0 mmol/L   Sodium 126 (L) 135 - 145 mmol/L   Potassium 4.7 3.5 - 5.1 mmol/L   Calcium, Ion 1.11 (L) 1.15 - 1.40 mmol/L   HCT 46.0 39.0 - 52.0 %   Hemoglobin 15.6 13.0 - 17.0 g/dL   Patient temperature HIDE    Collection site BRACHIAL ARTERY    Sample type VENOUS    Comment NOTIFIED PHYSICIAN     Chemistries  Recent Labs  Lab 04/25/19 2123 04/26/19 0102  NA 118* 126*  K 5.0 4.7  CL 79*  --   CO2 19*  --   GLUCOSE 1,076*  --   BUN 17  --   CREATININE 1.21  --   CALCIUM 9.1  --    ------------------------------------------------------------------------------------------------------------------  ------------------------------------------------------------------------------------------------------------------ GFR: CrCl cannot be calculated (Unknown ideal weight.). Liver Function Tests: No results for input(s): AST, ALT, ALKPHOS, BILITOT, PROT, ALBUMIN in the last 168 hours. No results for input(s): LIPASE, AMYLASE in the last 168 hours. No results for input(s): AMMONIA in the last 168 hours. Coagulation Profile: No results for input(s): INR, PROTIME in the last 168 hours. Cardiac Enzymes: No results for input(s): CKTOTAL, CKMB, CKMBINDEX, TROPONINI in the last 168 hours. BNP (last 3 results) No results for input(s): PROBNP in the last 8760 hours. HbA1C: No results for input(s): HGBA1C in the last 72 hours. CBG: Recent Labs  Lab 04/25/19 2125 04/26/19 0045  GLUCAP >600* >600*   Lipid Profile: No results for input(s): CHOL, HDL, LDLCALC, TRIG, CHOLHDL, LDLDIRECT in the last 72 hours. Thyroid Function Tests: No results for input(s): TSH, T4TOTAL, FREET4, T3FREE, THYROIDAB in the last 72 hours. Anemia Panel: No results for input(s): VITAMINB12, FOLATE, FERRITIN, TIBC, IRON, RETICCTPCT in the last 72  hours.  --------------------------------------------------------------------------------------------------------------- Urine analysis:    Component Value Date/Time   COLORURINE COLORLESS (A) 04/25/2019 2117   APPEARANCEUR CLEAR 04/25/2019 2117   LABSPEC 1.025 04/25/2019 2117   PHURINE 5.0 04/25/2019 2117   GLUCOSEU >=500 (A) 04/25/2019 2117   HGBUR NEGATIVE 04/25/2019 2117   BILIRUBINUR NEGATIVE 04/25/2019 2117   KETONESUR 20 (A) 04/25/2019 2117   PROTEINUR NEGATIVE 04/25/2019 2117   NITRITE NEGATIVE 04/25/2019 2117   LEUKOCYTESUR NEGATIVE 04/25/2019 2117      Imaging Results:    No results found.     Assessment & Plan:    Principal Problem:   Hyperosmolar hyperglycemic state (HHS) (HCC) Active Problems:   Weight loss  HHS NPO Ns iv Insulin iv Check bmp q4h,  Consider transition to Exline insulin when sugar improves  Dm2 Check ferritin, iron, tibc r/o hemochromatosis  Weight loss Check CXR Check CT abd/ pelvis Check TSH   DVT Prophylaxis-   Lovenox - SCDs   AM Labs Ordered, also please review Full Orders  Family Communication: Admission, patients condition and plan of care including tests being ordered have been  discussed with the patient who indicate understanding and agree with the plan and Code Status.  Code Status:  FULL CODE per patient, attempted to contact wife, no response  Admission status: Inpatient: Based on patients clinical presentation and evaluation of above clinical data, I have made determination that patient meets Inpatient criteria at this time.   Pt has sugar > 1000, pt will require iv insulin and has high risk of clinical deterioration.  Pt will require >2 nites stay to correct sugar.   Time spent in minutes :  55 minutes   Pearson Grippe M.D on 04/26/2019 at 2:06 AM

## 2019-04-26 NOTE — ED Notes (Signed)
CBG 206 

## 2019-04-26 NOTE — Progress Notes (Signed)
Inpatient Diabetes Program Recommendations  AACE/ADA: New Consensus Statement on Inpatient Glycemic Control (2015)  Target Ranges:  Prepandial:   less than 140 mg/dL      Peak postprandial:   less than 180 mg/dL (1-2 hours)      Critically ill patients:  140 - 180 mg/dL   Lab Results  Component Value Date   GLUCAP 190 (H) 04/26/2019    Review of Glycemic Control Results for AMARRION, PASTORINO (MRN [id number]) as of 04/26/2019 10:11  Ref. Range 04/26/2019 07:48 04/26/2019 08:40 04/26/2019 10:09  Glucose-Capillary Latest Ref Range: 70 - 99 mg/dL 237 (H) 217 (H) 190 (H)   Diabetes history: New onset Outpatient Diabetes medications: none Current orders for Inpatient glycemic control: IV insulin   Inpatient Diabetes Program Recommendations:    No noted A1C? Per MD note: A1C in office >16.9%.  Additionally, when ready to transition per MD (pending Beta hydroxy and BMET) consider:  -Levemir 10 units QD -Novolog 0-9 units TID & HS  Will order LWWDM, OP education, insulin starter kit, Dietitian consult and plan to speak with patient.   Patient will need blood glucose meter (inlcudes lancets and strips) (#[id number]).  Thanks, Bronson Curb, MSN, RNC-OB Diabetes Coordinator [phone] (8a-5p)

## 2019-04-26 NOTE — ED Notes (Signed)
ED TO INPATIENT HANDOFF REPORT  ED Nurse Name and Phone #: Shanikka Wonders, [phone]  S Name/Age/Gender Gary Stewart 64 y.o. male Room/Bed: 024C/024C  Code Status   Code Status: Full Code  Home/SNF/Other Home Patient oriented to: self, place, time and situation Is this baseline? Yes   Triage Complete: Triage complete  Chief Complaint DKA (diabetic ketoacidoses) ([place]) [E11.10]  Triage Note Pt sent here by doctor for abnormal lab work, hyperglycemia in DKA     Allergies Allergies  Allergen Reactions  . Penicillins Rash    Did it involve swelling of the face/tongue/throat, SOB, or low BP? No Did it involve sudden or severe rash/hives, skin peeling, or any reaction on the inside of your mouth or nose? Yes Did you need to seek medical attention at a hospital or doctor's office? Yes When did it last happen?2000 If all above answers are "NO", may proceed with cephalosporin use.     Level of Care/Admitting Diagnosis ED Disposition    ED Disposition Condition Comment   Admit  Hospital Area: [facility] [[id number]]  Level of Care: Progressive [102]  Admit to Progressive based on following criteria: GI, ENDOCRINE disease patients with GI bleeding, acute liver failure or pancreatitis, stable with diabetic ketoacidosis or thyrotoxicosis (hypothyroid) state.  Covid Evaluation: Asymptomatic Screening Protocol (No Symptoms)  Diagnosis: DKA (diabetic ketoacidoses) [facility]) [[id number]]  Admitting Physician: Jani Gravel Throop  Attending Physician: Jani Gravel [phone]  Estimated length of stay: past midnight tomorrow  Certification:: I certify this patient will need inpatient services for at least 2 midnights       B Medical/Surgery History Past Medical History:  Diagnosis Date  . Wears glasses    Past Surgical History:  Procedure Laterality Date  . EPIGASTRIC HERNIA REPAIR N/A 07/10/2014   Procedure: INCARCERATED EPIGASTRIC HERNIA REPAIR WITH MESH;  Surgeon: Armandina Gemma,  MD;  Location: [facility];  Service: General;  Laterality: N/A;  . 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 WISDOM TOOTH EXTRACTION       A IV Location/Drains/Wounds Patient Lines/Drains/Airways Status   Active Line/Drains/Airways    Name:   Placement date:   Placement time:   Site:   Days:   Peripheral IV 04/26/19 Right Antecubital   04/26/19    0059    Antecubital   less than 1   Peripheral IV 04/26/19 Right Hand   04/26/19    0059    Hand   less than 1   Incision (Closed) 07/10/14 Abdomen Other (Comment)   07/10/14    0806     1751          Intake/Output Last 24 hours  Intake/Output Summary (Last 24 hours) at 04/26/2019 2302 Last data filed at 04/26/2019 1516 Gross per 24 hour  Intake 200 ml  Output 340 ml  Net -140 ml    Labs/Imaging Results for orders placed or performed during the hospital encounter of 04/25/19 (from the past 48 hour(s))  Urinalysis, Routine w reflex microscopic     Status: Abnormal   Collection Time: 04/25/19  9:17 PM  Result Value Ref Range   Color, Urine COLORLESS (A) YELLOW   APPearance CLEAR CLEAR   Specific Gravity, Urine 1.025 1.005 - 1.030   pH 5.0 5.0 - 8.0   Glucose, UA >=500 (A) NEGATIVE mg/dL   Hgb urine dipstick NEGATIVE NEGATIVE   Bilirubin Urine NEGATIVE NEGATIVE   Ketones, ur 20 (A) NEGATIVE mg/dL   Protein, ur NEGATIVE NEGATIVE  mg/dL   Nitrite NEGATIVE NEGATIVE   Leukocytes,Ua NEGATIVE NEGATIVE   WBC, UA 0-5 0 - 5 WBC/hpf   Bacteria, UA NONE SEEN NONE SEEN   Mucus PRESENT     Comment: Performed at [facility] [address] [place], [place] [address]  Basic metabolic panel     Status: Abnormal   Collection Time: 04/25/19  9:23 PM  Result Value Ref Range   Sodium 118 (LL) 135 - 145 mmol/L    Comment: CRITICAL RESULT CALLED TO, READ BACK BY AND VERIFIED WITH: BAIN C,RN 04/25/19 2240 WAYK    Potassium 5.0 3.5 - 5.1 mmol/L   Chloride 79 (L) 98 - 111 mmol/L   CO2 19 (L) 22 - 32 mmol/L   Glucose, Bld 1,076  (HH) 70 - 99 mg/dL    Comment: CRITICAL RESULT CALLED TO, READ BACK BY AND VERIFIED WITH: BAIN C,RN 04/25/19 2240 WAYK    BUN 17 8 - 23 mg/dL   Creatinine, Ser 1.21 0.61 - 1.24 mg/dL   Calcium 9.1 8.9 - 10.3 mg/dL   GFR calc non Af Amer >60 >60 mL/min   GFR calc Af Amer >60 >60 mL/min   Anion gap 20 (H) 5 - 15    Comment: Performed at [facility], [place] [address], [place], [place] [address]  CBC     Status: Abnormal   Collection Time: 04/25/19  9:23 PM  Result Value Ref Range   WBC 7.5 4.0 - 10.5 K/uL   RBC 4.10 (L) 4.22 - 5.81 MIL/uL   Hemoglobin 13.5 13.0 - 17.0 g/dL   HCT 40.2 39.0 - 52.0 %   MCV 98.0 80.0 - 100.0 fL   MCH 32.9 26.0 - 34.0 pg   MCHC 33.6 30.0 - 36.0 g/dL   RDW 12.3 11.5 - 15.5 %   Platelets 225 150 - 400 K/uL   nRBC 0.0 0.0 - 0.2 %    Comment: Performed at [facility], [place] [address], [place], [place] [address]  CBG monitoring, ED     Status: Abnormal   Collection Time: 04/25/19  9:25 PM  Result Value Ref Range   Glucose-Capillary >600 (HH) 70 - 99 mg/dL  CBG monitoring, ED     Status: Abnormal   Collection Time: 04/26/19 12:45 AM  Result Value Ref Range   Glucose-Capillary >600 (HH) 70 - 99 mg/dL  Beta-hydroxybutyric acid     Status: Abnormal   Collection Time: 04/26/19 12:57 AM  Result Value Ref Range   Beta-Hydroxybutyric Acid 6.78 (H) 0.05 - 0.27 mmol/L    Comment: RESULTS CONFIRMED BY MANUAL DILUTION Performed at [facility], [place] [address] [place], [place] [address]   Basic metabolic panel     Status: Abnormal   Collection Time: 04/26/19 12:57 AM  Result Value Ref Range   Sodium 128 (L) 135 - 145 mmol/L   Potassium 4.6 3.5 - 5.1 mmol/L   Chloride 86 (L) 98 - 111 mmol/L   CO2 18 (L) 22 - 32 mmol/L   Glucose, Bld 666 (HH) 70 - 99 mg/dL    Comment: CRITICAL RESULT CALLED TO, READ BACK BY AND VERIFIED WITH: BECK B,RN 04/26/19 0232 WAYK    BUN 16 8 - 23 mg/dL   Creatinine, Ser 1.16 0.61 - 1.24 mg/dL   Calcium 9.5 8.9 -  10.3 mg/dL   GFR calc non Af Amer >60 >60 mL/min   GFR calc Af Amer >60 >60 mL/min   Anion  gap 24 (H) 5 - 15    Comment: Performed at [facility], [place] [address], [place], [place] [address]  POCT I-Stat EG7     Status: Abnormal   Collection Time: 04/26/19  1:02 AM  Result Value Ref Range   pH, Ven 7.381 7.250 - 7.430   pCO2, Ven 36.3 (L) 44.0 - 60.0 mmHg   pO2, Ven 39.0 32.0 - 45.0 mmHg   Bicarbonate 21.6 20.0 - 28.0 mmol/L   TCO2 23 22 - 32 mmol/L   O2 Saturation 73.0 %   Acid-base deficit 3.0 (H) 0.0 - 2.0 mmol/L   Sodium 126 (L) 135 - 145 mmol/L   Potassium 4.7 3.5 - 5.1 mmol/L   Calcium, Ion 1.11 (L) 1.15 - 1.40 mmol/L   HCT 46.0 39.0 - 52.0 %   Hemoglobin 15.6 13.0 - 17.0 g/dL   Patient temperature HIDE    Collection site BRACHIAL ARTERY    Sample type VENOUS    Comment NOTIFIED PHYSICIAN   CBG monitoring, ED     Status: Abnormal   Collection Time: 04/26/19  2:57 AM  Result Value Ref Range   Glucose-Capillary 533 (HH) 70 - 99 mg/dL   Comment 1 Notify RN   SARS CORONAVIRUS 2 (TAT 6-24 HRS) Nasopharyngeal Nasopharyngeal Swab     Status: None   Collection Time: 04/26/19  2:59 AM   Specimen: Nasopharyngeal Swab  Result Value Ref Range   SARS Coronavirus 2 NEGATIVE NEGATIVE    Comment: (NOTE) SARS-CoV-2 target nucleic acids are NOT DETECTED. The SARS-CoV-2 RNA is generally detectable in upper and lower respiratory specimens during the acute phase of infection. Negative results do not preclude SARS-CoV-2 infection, do not rule out co-infections with other pathogens, and should not be used as the sole basis for treatment or other patient management decisions. Negative results must be combined with clinical observations, patient history, and epidemiological information. The expected result is Negative. Fact Sheet for Patients: SugarRoll.be Fact Sheet for Healthcare Providers: https://www.woods-mathews.com/ This test is not  yet approved or cleared by the [place] FDA and  has been authorized for detection and/or diagnosis of SARS-CoV-2 by FDA under an Emergency Use Authorization (EUA). This EUA will remain  in effect (meaning this test can be used) for the duration of the COVID-19 declaration under Section 56 4(b)(1) of the Act, 21 U.S.C. section 360bbb-3(b)(1), unless the authorization is terminated or revoked sooner. Performed at [facility], [place] [address], [place], [place] [address]   CBG monitoring, ED     Status: Abnormal   Collection Time: 04/26/19  3:46 AM  Result Value Ref Range   Glucose-Capillary 457 (H) 70 - 99 mg/dL  CBG monitoring, ED     Status: Abnormal   Collection Time: 04/26/19  4:37 AM  Result Value Ref Range   Glucose-Capillary 363 (H) 70 - 99 mg/dL  HIV Antibody (routine testing w rflx)     Status: None   Collection Time: 04/26/19  5:44 AM  Result Value Ref Range   HIV Screen 4th Generation wRfx NON REACTIVE NON REACTIVE    Comment: Performed at [facility] [address], [place], [place] [address]  Ferritin     Status: Abnormal   Collection Time: 04/26/19  5:44 AM  Result Value Ref Range   Ferritin 985 (H) 24 - 336 ng/mL    Comment: Performed at [facility], [place] [address], [place], [place] [address]  Iron and TIBC  Status: Abnormal   Collection Time: 04/26/19  5:44 AM  Result Value Ref Range   Iron 65 45 - 182 ug/dL   TIBC 224 (L) 250 - 450 ug/dL   Saturation Ratios 29 17.9 - 39.5 %   UIBC 159 ug/dL    Comment: Performed at [facility] [address], [place], [place] [address]  TSH     Status: None   Collection Time: 04/26/19  5:44 AM  Result Value Ref Range   TSH 0.922 0.350 - 4.500 uIU/mL    Comment: Performed by a 3rd Generation assay with a functional sensitivity of <=0.01 uIU/mL. Performed at [facility], [place] [address], [place], [place] [address]   CBG monitoring, ED     Status: Abnormal   Collection Time: 04/26/19   5:46 AM  Result Value Ref Range   Glucose-Capillary 316 (H) 70 - 99 mg/dL  Basic metabolic panel     Status: Abnormal   Collection Time: 04/26/19  5:50 AM  Result Value Ref Range   Sodium 135 135 - 145 mmol/L   Potassium 3.8 3.5 - 5.1 mmol/L   Chloride 99 98 - 111 mmol/L   CO2 21 (L) 22 - 32 mmol/L   Glucose, Bld 321 (H) 70 - 99 mg/dL   BUN 10 8 - 23 mg/dL   Creatinine, Ser 0.99 0.61 - 1.24 mg/dL   Calcium 9.0 8.9 - 10.3 mg/dL   GFR calc non Af Amer >60 >60 mL/min   GFR calc Af Amer >60 >60 mL/min   Anion gap 15 5 - 15    Comment: Performed at [facility], [place] [address] [place], [place] [address]  CBG monitoring, ED     Status: Abnormal   Collection Time: 04/26/19  6:41 AM  Result Value Ref Range   Glucose-Capillary 253 (H) 70 - 99 mg/dL  CBG monitoring, ED     Status: Abnormal   Collection Time: 04/26/19  7:48 AM  Result Value Ref Range   Glucose-Capillary 237 (H) 70 - 99 mg/dL  CBG monitoring, ED     Status: Abnormal   Collection Time: 04/26/19  8:40 AM  Result Value Ref Range   Glucose-Capillary 217 (H) 70 - 99 mg/dL  Beta-hydroxybutyric acid     Status: Abnormal   Collection Time: 04/26/19  9:41 AM  Result Value Ref Range   Beta-Hydroxybutyric Acid 1.52 (H) 0.05 - 0.27 mmol/L    Comment: Performed at [facility] [address] [place], [place] [address]  Basic metabolic panel     Status: Abnormal   Collection Time: 04/26/19  9:41 AM  Result Value Ref Range   Sodium 138 135 - 145 mmol/L   Potassium 3.3 (L) 3.5 - 5.1 mmol/L   Chloride 101 98 - 111 mmol/L   CO2 25 22 - 32 mmol/L   Glucose, Bld 216 (H) 70 - 99 mg/dL   BUN 9 8 - 23 mg/dL   Creatinine, Ser 0.74 0.61 - 1.24 mg/dL   Calcium 8.8 (L) 8.9 - 10.3 mg/dL   GFR calc non Af Amer >60 >60 mL/min   GFR calc Af Amer >60 >60 mL/min   Anion gap 12 5 - 15    Comment: Performed at [facility] [address] [place], [place] [address]  CBG monitoring, ED     Status: Abnormal   Collection Time:  04/26/19 10:09 AM  Result Value Ref Range   Glucose-Capillary 190 (H)  70 - 99 mg/dL  CBG monitoring, ED     Status: Abnormal   Collection Time: 04/26/19 11:03 AM  Result Value Ref Range   Glucose-Capillary 204 (H) 70 - 99 mg/dL  CBG monitoring, ED     Status: Abnormal   Collection Time: 04/26/19 12:29 PM  Result Value Ref Range   Glucose-Capillary 200 (H) 70 - 99 mg/dL  Basic metabolic panel     Status: Abnormal   Collection Time: 04/26/19  1:13 PM  Result Value Ref Range   Sodium 137 135 - 145 mmol/L   Potassium 3.3 (L) 3.5 - 5.1 mmol/L   Chloride 101 98 - 111 mmol/L   CO2 25 22 - 32 mmol/L   Glucose, Bld 198 (H) 70 - 99 mg/dL   BUN 8 8 - 23 mg/dL   Creatinine, Ser 0.67 0.61 - 1.24 mg/dL   Calcium 8.5 (L) 8.9 - 10.3 mg/dL   GFR calc non Af Amer >60 >60 mL/min   GFR calc Af Amer >60 >60 mL/min   Anion gap 11 5 - 15    Comment: Performed at [facility], [place] [address] [place], [place] [address]  Hepatic function panel     Status: Abnormal   Collection Time: 04/26/19  1:13 PM  Result Value Ref Range   Total Protein 5.2 (L) 6.5 - 8.1 g/dL   Albumin 2.8 (L) 3.5 - 5.0 g/dL   AST 20 15 - 41 U/L   ALT 32 0 - 44 U/L   Alkaline Phosphatase 49 38 - 126 U/L   Total Bilirubin 1.0 0.3 - 1.2 mg/dL   Bilirubin, Direct 0.1 0.0 - 0.2 mg/dL   Indirect Bilirubin 0.9 0.3 - 0.9 mg/dL    Comment: Performed at [facility] [address] [place], [place] [address]  CBG monitoring, ED     Status: Abnormal   Collection Time: 04/26/19  1:58 PM  Result Value Ref Range   Glucose-Capillary 180 (H) 70 - 99 mg/dL  CBG monitoring, ED     Status: Abnormal   Collection Time: 04/26/19  3:10 PM  Result Value Ref Range   Glucose-Capillary 206 (H) 70 - 99 mg/dL  CBG monitoring, ED     Status: Abnormal   Collection Time: 04/26/19  4:41 PM  Result Value Ref Range   Glucose-Capillary 190 (H) 70 - 99 mg/dL  Beta-hydroxybutyric acid     Status: Abnormal   Collection Time: 04/26/19  5:50 PM   Result Value Ref Range   Beta-Hydroxybutyric Acid 0.86 (H) 0.05 - 0.27 mmol/L    Comment: Performed at [facility], [place] [address] [place], [place] [address]  Basic metabolic panel     Status: Abnormal   Collection Time: 04/26/19  5:50 PM  Result Value Ref Range   Sodium 138 135 - 145 mmol/L   Potassium 3.2 (L) 3.5 - 5.1 mmol/L   Chloride 103 98 - 111 mmol/L   CO2 25 22 - 32 mmol/L   Glucose, Bld 165 (H) 70 - 99 mg/dL   BUN 9 8 - 23 mg/dL   Creatinine, Ser 0.60 (L) 0.61 - 1.24 mg/dL   Calcium 8.9 8.9 - 10.3 mg/dL   GFR calc non Af Amer >60 >60 mL/min   GFR calc Af Amer >60 >60 mL/min   Anion gap 10 5 - 15    Comment: Performed at [facility] [address], [place], [place] [address]  CBG monitoring, ED  Status: Abnormal   Collection Time: 04/26/19  5:53 PM  Result Value Ref Range   Glucose-Capillary 144 (H) 70 - 99 mg/dL  CBG monitoring, ED     Status: Abnormal   Collection Time: 04/26/19  7:11 PM  Result Value Ref Range   Glucose-Capillary 274 (H) 70 - 99 mg/dL  CBG monitoring, ED     Status: Abnormal   Collection Time: 04/26/19  8:27 PM  Result Value Ref Range   Glucose-Capillary 277 (H) 70 - 99 mg/dL  CBG monitoring, ED     Status: Abnormal   Collection Time: 04/26/19  9:23 PM  Result Value Ref Range   Glucose-Capillary 223 (H) 70 - 99 mg/dL   DG Chest 2 View  Result Date: 04/26/2019 CLINICAL DATA:  Hyperglycemia at PCP office EXAM: CHEST - 2 VIEW COMPARISON:  None. FINDINGS: No consolidation, features of edema, pneumothorax, or effusion. Pulmonary vascularity is normally distributed. The cardiomediastinal contours are unremarkable. No acute osseous or soft tissue abnormality. Degenerative changes are present in the imaged spine and shoulders. IMPRESSION: No acute cardiopulmonary abnormality. Electronically Signed   By: Lovena Le M.D.   On: 04/26/2019 03:29   CT ABDOMEN PELVIS W CONTRAST  Result Date: 04/26/2019 CLINICAL DATA:  Unintended weight  loss, 65 pounds in 2 weeks per patient EXAM: CT ABDOMEN AND PELVIS WITH CONTRAST TECHNIQUE: Multidetector CT imaging of the abdomen and pelvis was performed using the standard protocol following bolus administration of intravenous contrast. CONTRAST:  133m OMNIPAQUE IOHEXOL 300 MG/ML  SOLN COMPARISON:  None FINDINGS: Lower chest: Small air cyst in the right lung base. Minimal atelectatic change. Normal heart size. No pericardial effusion. Hepatobiliary: Subcentimeter hypoattenuating focus along the posterior left lobe liver (3/21) is too small to fully characterize on CT imaging but statistically likely benign. No worrisome hepatic lesions, no gallstones, gallbladder wall thickening, or biliary dilatation. Pancreas: Unremarkable. No pancreatic ductal dilatation or surrounding inflammatory changes. Spleen: Normal in size without focal abnormality. Adrenals/Urinary Tract: Adrenal glands are unremarkable. Kidneys are normal, without renal calculi, focal lesion, or hydronephrosis. There is focal anterior bladder wall thickening. Stomach/Bowel: Distal esophagus, stomach and duodenal sweep are unremarkable. No small bowel wall thickening or dilatation. No evidence of obstruction. A normal appendix is visualized. No colonic dilatation or wall thickening. Scattered colonic diverticula without focal pericolonic inflammation to suggest diverticulitis. Vascular/Lymphatic: Atherosclerotic plaque within the normal caliber aorta. No suspicious or enlarged lymph nodes in the included lymphatic chains. Reproductive: Coarse likely benign calcification of the prostate. No concerning abnormalities of the prostate or seminal vesicles. Other: Evidence of prior ventral hernia repair (3/54). No bowel containing hernias. No free air or fluid in the abdomen or pelvis. Musculoskeletal: Multilevel degenerative changes are present in the imaged portions of the spine. No acute osseous abnormality or suspicious osseous lesion. IMPRESSION: 1.  Focal anterior bladder wall thickening. Correlate with urinalysis to exclude cystitis and consider further evaluation with cystoscopy. 2. Colonic diverticulosis without evidence of acute diverticulitis. 3. Evidence of prior ventral hernia repair. Small fat containing bilateral inguinal hernias. 4.  Aortic Atherosclerosis (ICD10-I70.0). Electronically Signed   By: PLovena LeM.D.   On: 04/26/2019 03:04    Pending Labs Unresulted Labs (From admission, onward)    Start     Ordered   05/03/19 0500  Creatinine, serum  (enoxaparin (LOVENOX)    CrCl >/= 30 ml/min)  Weekly,   R    Comments: while on enoxaparin therapy    04/26/19 0116   04/26/19 0049  Blood gas, venous  (Diabetes Ketoacidosis (DKA))  Once,   STAT     04/26/19 0050          Vitals/Pain Today's Vitals   04/26/19 1900 04/26/19 1916 04/26/19 2000 04/26/19 2100  BP: 112/63  106/60 (!) 92/59  Pulse: 61  67 (!) 57  Resp: '16  14 12  '$ Temp:      TempSrc:      SpO2: 97%  97% 96%  Weight:      Height:      PainSc:  0-No pain      Isolation Precautions No active isolations  Medications Medications  lactated ringers bolus 1,000 mL (1,000 mLs Intravenous Not Given 04/26/19 0349)  enoxaparin (LOVENOX) injection 40 mg (40 mg Subcutaneous Given 04/26/19 1123)  insulin regular, human (MYXREDLIN) 100 units/ 100 mL infusion (4.8 Units/hr Intravenous Rate/Dose Change 04/26/19 1915)  0.9 %  sodium chloride infusion ( Intravenous New Bag/Given 04/26/19 1122)  dextrose 5 %-0.45 % sodium chloride infusion ( Intravenous New Bag/Given 04/26/19 0755)  dextrose 50 % solution 0-50 mL (has no administration in time range)  living well with diabetes book MISC (has no administration in time range)  insulin starter kit- pen needles (English) 1 kit (has no administration in time range)  insulin glargine (LANTUS) injection 20 Units (20 Units Subcutaneous Given 04/26/19 1914)  potassium chloride 10 mEq in 100 mL IVPB (0 mEq Intravenous Stopped 04/26/19 0504)   iohexol (OMNIPAQUE) 300 MG/ML solution 100 mL (100 mLs Intravenous Contrast Given 04/26/19 0242)    Mobility walks Low fall risk   Focused Assessments     R Recommendations: See Admitting Provider Note  Report given to:   Additional Notes:

## 2019-04-27 DIAGNOSIS — M21372 Foot drop, left foot: Secondary | ICD-10-CM

## 2019-04-27 DIAGNOSIS — E1110 Type 2 diabetes mellitus with ketoacidosis without coma: Principal | ICD-10-CM

## 2019-04-27 LAB — GLUCOSE, CAPILLARY
Glucose-Capillary: 118 mg/dL — ABNORMAL HIGH (ref 70–99)
Glucose-Capillary: 123 mg/dL — ABNORMAL HIGH (ref 70–99)
Glucose-Capillary: 144 mg/dL — ABNORMAL HIGH (ref 70–99)
Glucose-Capillary: 159 mg/dL — ABNORMAL HIGH (ref 70–99)
Glucose-Capillary: 171 mg/dL — ABNORMAL HIGH (ref 70–99)
Glucose-Capillary: 173 mg/dL — ABNORMAL HIGH (ref 70–99)
Glucose-Capillary: 181 mg/dL — ABNORMAL HIGH (ref 70–99)
Glucose-Capillary: 258 mg/dL — ABNORMAL HIGH (ref 70–99)

## 2019-04-27 LAB — BASIC METABOLIC PANEL
Anion gap: 10 (ref 5–15)
BUN: 13 mg/dL (ref 8–23)
CO2: 24 mmol/L (ref 22–32)
Calcium: 8.4 mg/dL — ABNORMAL LOW (ref 8.9–10.3)
Chloride: 101 mmol/L (ref 98–111)
Creatinine, Ser: 0.78 mg/dL (ref 0.61–1.24)
GFR calc Af Amer: >60 mL/min (ref >60)
GFR calc non Af Amer: >60 mL/min (ref >60)
Glucose, Bld: 297 mg/dL — ABNORMAL HIGH (ref 70–99)
Potassium: 3.7 mmol/L (ref 3.5–5.1)
Sodium: 135 mmol/L (ref 135–145)

## 2019-04-27 LAB — HEMOGLOBIN A1C: Hgb A1c MFr Bld: >18.5 % — ABNORMAL HIGH (ref 4.8–5.6)

## 2019-04-27 MED ORDER — INSULIN PEN NEEDLE 31G X 5 MM MISC: 0 refills | Status: DC

## 2019-04-27 MED ORDER — INSULIN ASPART 100 UNIT/ML ~~LOC~~ SOLN: 0.0000 [IU] | Freq: Every day | SUBCUTANEOUS | Status: DC

## 2019-04-27 MED ORDER — INSULIN ASPART 100 UNIT/ML ~~LOC~~ SOLN
3.0000 [IU] | Freq: Three times a day (TID) | SUBCUTANEOUS | Status: DC
  Administered 2019-04-27: 13:00:00 3 [IU] via SUBCUTANEOUS

## 2019-04-27 MED ORDER — BLOOD GLUCOSE METER KIT: PACK | 0 refills | Status: AC

## 2019-04-27 MED ORDER — INSULIN GLARGINE 100 UNIT/ML ~~LOC~~ SOLN
20.0000 [IU] | Freq: Every day | SUBCUTANEOUS | Status: DC
  Filled 2019-04-27: qty 0.2

## 2019-04-27 MED ORDER — INSULIN ASPART 100 UNIT/ML ~~LOC~~ SOLN
0.0000 [IU] | Freq: Three times a day (TID) | SUBCUTANEOUS | Status: DC
  Administered 2019-04-27: 07:00:00 2 [IU] via SUBCUTANEOUS
  Administered 2019-04-27: 13:00:00 8 [IU] via SUBCUTANEOUS

## 2019-04-27 MED ORDER — LANTUS SOLOSTAR 100 UNIT/ML ~~LOC~~ SOPN: 20.0000 [IU] | PEN_INJECTOR | Freq: Every day | SUBCUTANEOUS | 0 refills | Status: DC

## 2019-04-27 MED ORDER — NOVOLOG FLEXPEN 100 UNIT/ML ~~LOC~~ SOPN: 4.0000 [IU] | PEN_INJECTOR | Freq: Three times a day (TID) | SUBCUTANEOUS | 0 refills | Status: DC

## 2019-04-27 NOTE — Plan of Care (Signed)
Discussed diabetes management and instructed patient to watch diabetes educational video with spouse when she came.

## 2019-04-27 NOTE — TOC Benefit Eligibility Note (Signed)
Transition of Care [facility]) Benefit Eligibility Note    Patient Details  Name: COSMO TETREAULT MRN: [id number] Date of Birth: 01/11/1956   Medication/Dose: Levemir and Lantus has not co payment  Covered?: Yes  Tier: 3 Drug  Prescription Coverage Preferred Pharmacy: [facility]  Spoke with Person/Company/Phone Number:: [employer]  Co-Pay: NO co payment for 30 day supply or 90 day supply  Prior Approval: No  Deductible: Unmet  Additional Notes: There is not a copayment associated with these Medication    Dorena Bodo Phone Number: 04/27/2019, 2:38 PM

## 2019-04-27 NOTE — Evaluation (Signed)
Physical Therapy Evaluation & Discharge Patient Details Name: Gary Stewart MRN: [id number] DOB: 1955-08-26 Today's Date: 04/27/2019   History of Present Illness  Pt is a 64 y.o. male admitted 04/24/18 secondary to abnormal labwork with PCP and found to have evidence of HHS, started on IV insulin drip. Pt also with 65lb weight loss and L foot drop (for ~2 months; pt thinks related to COVID onset which he had 2 months prior). No pertinent PMH on file.    Clinical Impression  Patient evaluated by Physical Therapy with no further acute PT needs identified. PTA, pt independent and recently suffered from COVID-19 ~2 months prior which coincides with onset of L foot drop. Pt with 4-5/5 strength through BLEs, except 1/5 strength in L DF; also with decreased sensation in L lower leg/foot/toes. Pt aware of option to pursue outpatient lumbar MRI through PCP, as well as AFO use for foot drop, currently not interested in either. Educ re: sign/symptom progression and when to alert MD, LE therex, balance strategies/fall risk reduction. All education has been completed and the patient has no further questions. Acute PT is signing off. Thank you for this referral.    Follow Up Recommendations No PT follow up    Equipment Recommendations  None recommended by PT    Recommendations for Other Services       Precautions / Restrictions Precautions Precautions: Other (comment) Precaution Comments: L foot drop Restrictions Weight Bearing Restrictions: No      Mobility  Bed Mobility Overal bed mobility: Independent                Transfers Overall transfer level: Independent                  Ambulation/Gait Ambulation/Gait assistance: Independent           General Gait Details: Pt automatically compensating for L foot drop with increased hip flexion  Stairs            Wheelchair Mobility    Modified Rankin (Stroke Patients Only)       Balance Overall balance  assessment: No apparent balance deficits (not formally assessed)                                           Pertinent Vitals/Pain Pain Assessment: No/denies pain    Home Living Family/patient expects to be discharged to:: Private residence Living Arrangements: Alone Available Help at Discharge: Family Type of Home: House                Prior Function Level of Independence: Independent         Comments: Dx with COVID ~2 months ago, pt now able to walk (can make it around [employer]) but reports some persistent endurance issues. Also with L foot drop which started around same time as COVID, no other trauma or h/o lumbar issues     Hand Dominance        Extremity/Trunk Assessment   Upper Extremity Assessment Upper Extremity Assessment: Overall WFL for tasks assessed    Lower Extremity Assessment Lower Extremity Assessment: LLE deficits/detail LLE Deficits / Details: Hip flex 4/5, knee flex/ext 5/5, ankle PF 5/5, ankle DF 1/5; decreased light touch sensation on toes/foot/lower leg, some relation to L4 nerve root distribution    Cervical / Trunk Assessment Cervical / Trunk Assessment: Normal  Communication   Communication: No difficulties  Cognition Arousal/Alertness: Awake/alert Behavior During Therapy: WFL for tasks assessed/performed Overall Cognitive Status: Within Functional Limits for tasks assessed                                        General Comments General comments (skin integrity, edema, etc.): Pt has been dealing with foot drop for ~2 months; discussed current status and signs/symptoms to be mindful of regarding improvement or progression, when to call MD/PCP; pt aware of option for outpatient lumbar MRI, but not currently interested in pursuing; also discussed L foot AFO, pt also not currently interested but aware of options    Exercises Other Exercises Other Exercises: L toe curls/fine motor exercises; continuing to  attempt DF daily for muscle activation; gastroc stretch   Assessment/Plan    PT Assessment Patent does not need any further PT services  PT Problem List         PT Treatment Interventions      PT Goals (Current goals can be found in the Care Plan section)  Acute Rehab PT Goals PT Goal Formulation: All assessment and education complete, DC therapy    Frequency     Barriers to discharge        Co-evaluation               AM-PAC PT "6 Clicks" Mobility  Outcome Measure Help needed turning from your back to your side while in a flat bed without using bedrails?: None Help needed moving from lying on your back to sitting on the side of a flat bed without using bedrails?: None Help needed moving to and from a bed to a chair (including a wheelchair)?: None Help needed standing up from a chair using your arms (e.g., wheelchair or bedside chair)?: None Help needed to walk in hospital room?: None Help needed climbing 3-5 steps with a railing? : None 6 Click Score: 24    End of Session   Activity Tolerance: Patient tolerated treatment well Patient left: with nursing/sitter in room;with call bell/phone within reach;with family/visitor present Nurse Communication: Mobility status PT Visit Diagnosis: Other abnormalities of gait and mobility (R26.89)    Time: 3818-2993 PT Time Calculation (min) (ACUTE ONLY): 17 min   Charges:   PT Evaluation $PT Eval Low Complexity: 1 Low     Mabeline Caras, PT, DPT Acute Rehabilitation Services  Pager [phone] Office [place] 04/27/2019, 12:50 PM

## 2019-04-27 NOTE — Discharge Instructions (Signed)
Gary Stewart,   You were in the hospital because of hyperglycemia from diabetes. You will be discharged on Insulin. Please take as directed. You will need close follow-up with your PCP to ensure good management of your diabetes as it can affect many organs, including eyes, heart, liver, nervous system, male genital system.   Diabetes Mellitus and Sick Day Management Blood sugar (glucose) can be difficult to control when you are sick. Common illnesses that can cause problems for people with diabetes (diabetes mellitus) include colds, fever, flu (influenza), nausea, vomiting, and diarrhea. These illnesses can cause stress and loss of body fluids (dehydration), and those issues can cause blood glucose levels to increase. Because of this, it is very important to take your insulin and diabetes medicines and eat some form of carbohydrate when you are sick. You should make a plan for days when you are sick (sick day plan) as part of your diabetes management plan. You and your health care provider should make this plan in advance. The following guidelines are intended to help you manage an illness that lasts for about 24 hours or less. Your health care provider may also give you more specific instructions. What do I need to do to manage my blood glucose?   Check your blood glucose every 2-4 hours, or as often as told by your health care provider.  Know your sick day treatment goals. Your target blood glucose levels may be different when you are sick.  If you use insulin, take your usual dose. ? If your blood glucose continues to be too high, you may need to take an additional insulin dose as told by your health care provider.  If you use oral diabetes medicine, you may need to stop taking it if you are not able to eat or drink normally. Ask your health care provider about whether you need to stop taking these medicines while you are sick.  If you use injectable hormone medicines other than insulin  to control your diabetes, ask your health care provider about whether you need to stop taking these medicines while you are sick. What else can I do to manage my diabetes when I am sick? Check your ketones  If you have type 1 diabetes, check your urine ketones every 4 hours.  If you have type 2 diabetes, check your urine ketones as often as told by your health care provider. Drink fluids  Drink enough fluid to keep your urine clear or pale yellow. This is especially important if you have a fever, vomiting, or diarrhea. Those symptoms can lead to dehydration.  Follow any instructions from your health care provider about beverages to avoid. ? Do not drink alcohol, caffeine, or drinks that contain a lot of sugar. Take medicines as directed  Take-over-the-counter and prescription medicines only as told by your health care provider.  Check medicine labels for added sugars. Some medicines may contain sugar or types of sugars that can raise your blood glucose level. What foods can I eat when I am sick?  You need to eat some form of carbohydrates when you are sick. You should eat 45-50 grams (45-50 g) of carbohydrates every 3-4 hours until you feel better. All of the food choices below contain about 15 g of carbohydrates. Plan ahead and keep some of these foods around so you have them if you get sick.  4-6 oz (120-177 mL) carbonated beverage that contains sugar, such as regular (not diet) soda. You may be able to  drink carbonated beverages more easily if you open the beverage and let it sit at room temperature for a few minutes before drinking.   of a twin frozen ice pop.  4 oz (120 g) regular gelatin.  4 oz (120 mL) fruit juice.  4 oz (120 g) ice cream or frozen yogurt.  2 oz (60 g) sherbet.  8 oz (240 mL) clear broth or soup.  4 oz (120 g) regular custard.  4 oz (120 g) regular pudding.  8 oz (240 g) plain yogurt.  1 slice bread or toast.  6 saltine crackers.  5 vanilla  wafers. Questions to ask your health care provider Consider asking the following questions so you know what to do on days when you are sick:  Should I adjust my diabetes medicines?  How often do I need to check my blood glucose?  What supplies do I need to manage my diabetes at home when I am sick?  What number can I call if I have questions?  What foods and drinks should I avoid? Contact a health care provider if:  You develop symptoms of diabetic ketoacidosis, such as: ? Fatigue. ? Weight loss. ? Excessive thirst. ? Light-headedness. ? Fruity or sweet-smelling breath. ? Excessive urination. ? Vision changes. ? Confusion or irritability. ? Nausea. ? Vomiting. ? Rapid breathing. ? Pain in the abdomen. ? Feeling flushed.  You are unable to drink fluids without vomiting.  You have any of the following for more than 6 hours: ? Nausea. ? Vomiting. ? Diarrhea.  Your blood glucose is at or above 240 mg/dL (13.3 mmol/L), even after you take an additional insulin dose.  You have a change in how you think, feel, or act (mental status).  You develop another serious illness.  You have been sick or have had a fever for 2 days or longer and you are not getting better. Get help right away if:  Your blood glucose is lower than 54 mg/dL (3.0 mmol/L).  You have difficulty breathing.  You have moderate or high ketone levels in your urine.  You used emergency glucagon to treat low blood glucose. Summary  Blood sugar (glucose) can be difficult to control when you are sick. Common illnesses that can cause problems for people with diabetes (diabetes mellitus) include colds, fever, flu (influenza), nausea, vomiting, and diarrhea.  Illnesses can cause stress and loss of body fluids (dehydration), and those issues can cause blood glucose levels to increase.  Make a plan for days when you are sick (sick day plan) as part of your diabetes management plan. You and your health care  provider should make this plan in advance.  It is very important to take your insulin and diabetes medicines and to eat some form of carbohydrate when you are sick.  Contact your health care provider if have problems managing your blood glucose levels when you are sick, or if you have been sick or had a fever for 2 days or longer and are not getting better. This information is not intended to replace advice given to you by your health care provider. Make sure you discuss any questions you have with your health care provider. Document Revised: 01/03/2016 Document Reviewed: 01/03/2016 Elsevier Patient Education  [place].  Diabetes Mellitus and Sick Day Management Blood sugar (glucose) can be difficult to control when you are sick. Common illnesses that can cause problems for people with diabetes (diabetes mellitus) include colds, fever, flu (influenza), nausea, vomiting, and diarrhea. These  illnesses can cause stress and loss of body fluids (dehydration), and those issues can cause blood glucose levels to increase. Because of this, it is very important to take your insulin and diabetes medicines and eat some form of carbohydrate when you are sick. You should make a plan for days when you are sick (sick day plan) as part of your diabetes management plan. You and your health care provider should make this plan in advance. The following guidelines are intended to help you manage an illness that lasts for about 24 hours or less. Your health care provider may also give you more specific instructions. What do I need to do to manage my blood glucose?   Check your blood glucose every 2-4 hours, or as often as told by your health care provider.  Know your sick day treatment goals. Your target blood glucose levels may be different when you are sick.  If you use insulin, take your usual dose. ? If your blood glucose continues to be too high, you may need to take an additional insulin dose as told  by your health care provider.  If you use oral diabetes medicine, you may need to stop taking it if you are not able to eat or drink normally. Ask your health care provider about whether you need to stop taking these medicines while you are sick.  If you use injectable hormone medicines other than insulin to control your diabetes, ask your health care provider about whether you need to stop taking these medicines while you are sick. What else can I do to manage my diabetes when I am sick? Check your ketones  If you have type 1 diabetes, check your urine ketones every 4 hours.  If you have type 2 diabetes, check your urine ketones as often as told by your health care provider. Drink fluids  Drink enough fluid to keep your urine clear or pale yellow. This is especially important if you have a fever, vomiting, or diarrhea. Those symptoms can lead to dehydration.  Follow any instructions from your health care provider about beverages to avoid. ? Do not drink alcohol, caffeine, or drinks that contain a lot of sugar. Take medicines as directed  Take-over-the-counter and prescription medicines only as told by your health care provider.  Check medicine labels for added sugars. Some medicines may contain sugar or types of sugars that can raise your blood glucose level. What foods can I eat when I am sick?  You need to eat some form of carbohydrates when you are sick. You should eat 45-50 grams (45-50 g) of carbohydrates every 3-4 hours until you feel better. All of the food choices below contain about 15 g of carbohydrates. Plan ahead and keep some of these foods around so you have them if you get sick.  4-6 oz (120-177 mL) carbonated beverage that contains sugar, such as regular (not diet) soda. You may be able to drink carbonated beverages more easily if you open the beverage and let it sit at room temperature for a few minutes before drinking.   of a twin frozen ice pop.  4 oz (120 g)  regular gelatin.  4 oz (120 mL) fruit juice.  4 oz (120 g) ice cream or frozen yogurt.  2 oz (60 g) sherbet.  8 oz (240 mL) clear broth or soup.  4 oz (120 g) regular custard.  4 oz (120 g) regular pudding.  8 oz (240 g) plain yogurt.  1 slice bread or  toast.  6 saltine crackers.  5 vanilla wafers. Questions to ask your health care provider Consider asking the following questions so you know what to do on days when you are sick:  Should I adjust my diabetes medicines?  How often do I need to check my blood glucose?  What supplies do I need to manage my diabetes at home when I am sick?  What number can I call if I have questions?  What foods and drinks should I avoid? Contact a health care provider if:  You develop symptoms of diabetic ketoacidosis, such as: ? Fatigue. ? Weight loss. ? Excessive thirst. ? Light-headedness. ? Fruity or sweet-smelling breath. ? Excessive urination. ? Vision changes. ? Confusion or irritability. ? Nausea. ? Vomiting. ? Rapid breathing. ? Pain in the abdomen. ? Feeling flushed.  You are unable to drink fluids without vomiting.  You have any of the following for more than 6 hours: ? Nausea. ? Vomiting. ? Diarrhea.  Your blood glucose is at or above 240 mg/dL (13.3 mmol/L), even after you take an additional insulin dose.  You have a change in how you think, feel, or act (mental status).  You develop another serious illness.  You have been sick or have had a fever for 2 days or longer and you are not getting better. Get help right away if:  Your blood glucose is lower than 54 mg/dL (3.0 mmol/L).  You have difficulty breathing.  You have moderate or high ketone levels in your urine.  You used emergency glucagon to treat low blood glucose. Summary  Blood sugar (glucose) can be difficult to control when you are sick. Common illnesses that can cause problems for people with diabetes (diabetes mellitus) include colds,  fever, flu (influenza), nausea, vomiting, and diarrhea.  Illnesses can cause stress and loss of body fluids (dehydration), and those issues can cause blood glucose levels to increase.  Make a plan for days when you are sick (sick day plan) as part of your diabetes management plan. You and your health care provider should make this plan in advance.  It is very important to take your insulin and diabetes medicines and to eat some form of carbohydrate when you are sick.  Contact your health care provider if have problems managing your blood glucose levels when you are sick, or if you have been sick or had a fever for 2 days or longer and are not getting better. This information is not intended to replace advice given to you by your health care provider. Make sure you discuss any questions you have with your health care provider. Document Revised: 01/03/2016 Document Reviewed: 01/03/2016 Elsevier Patient Education  [place].  Hypoglycemia Hypoglycemia is when the sugar (glucose) level in your blood is too low. Signs of low blood sugar may include:  Feeling: ? Hungry. ? Worried or nervous (anxious). ? Sweaty and clammy. ? Confused. ? Dizzy. ? Sleepy. ? Sick to your stomach (nauseous).  Having: ? A fast heartbeat. ? A headache. ? A change in your vision. ? Tingling or no feeling (numbness) around your mouth, lips, or tongue. ? Jerky movements that you cannot control (seizure).  Having trouble with: ? Moving (coordination). ? Sleeping. ? Passing out (fainting). ? Getting upset easily (irritability). Low blood sugar can happen to people who have diabetes and people who do not have diabetes. Low blood sugar can happen quickly, and it can be an emergency. Treating low blood sugar Low blood sugar is often  treated by eating or drinking something sugary right away, such as:  Fruit juice, 4-6 oz (120-150 mL).  Regular soda (not diet soda), 4-6 oz (120-150 mL).  Low-fat milk,  4 oz (120 mL).  Several pieces of hard candy.  Sugar or honey, 1 Tbsp (15 mL). Treating low blood sugar if you have diabetes If you can think clearly and swallow safely, follow the 15:15 rule:  Take 15 grams of a fast-acting carb (carbohydrate). Talk with your doctor about how much you should take.  Always keep a source of fast-acting carb with you, such as: ? Sugar tablets (glucose pills). Take 3-4 pills. ? 6-8 pieces of hard candy. ? 4-6 oz (120-150 mL) of fruit juice. ? 4-6 oz (120-150 mL) of regular (not diet) soda. ? 1 Tbsp (15 mL) honey or sugar.  Check your blood sugar 15 minutes after you take the carb.  If your blood sugar is still at or below 70 mg/dL (3.9 mmol/L), take 15 grams of a carb again.  If your blood sugar does not go above 70 mg/dL (3.9 mmol/L) after 3 tries, get help right away.  After your blood sugar goes back to normal, eat a meal or a snack within 1 hour.  Treating very low blood sugar If your blood sugar is at or below 54 mg/dL (3 mmol/L), you have very low blood sugar (severe hypoglycemia). This may also cause:  Passing out.  Jerky movements you cannot control (seizure).  Losing consciousness (coma). This is an emergency. Do not wait to see if the symptoms will go away. Get medical help right away. Call your local emergency services (911 in the [place] Do not drive yourself to the hospital. If you have very low blood sugar and you cannot eat or drink, you may need a glucagon shot (injection). A family member or friend should learn how to check your blood sugar and how to give you a glucagon shot. Ask your doctor if you need to have a glucagon shot kit at home. Follow these instructions at home: General instructions  Take over-the-counter and prescription medicines only as told by your doctor.  Stay aware of your blood sugar as told by your doctor.  Limit alcohol intake to no more than 1 drink a day for nonpregnant women and 2 drinks a day for men.  One drink equals 12 oz of beer (355 mL), 5 oz of wine (148 mL), or 1 oz of hard liquor (44 mL).  Keep all follow-up visits as told by your doctor. This is important. If you have diabetes:   Follow your diabetes care plan as told by your doctor. Make sure you: ? Know the signs of low blood sugar. ? Take your medicines as told. ? Follow your exercise and meal plan. ? Eat on time. Do not skip meals. ? Check your blood sugar as often as told by your doctor. Always check it before and after exercise. ? Follow your sick day plan when you cannot eat or drink normally. Make this plan ahead of time with your doctor.  Share your diabetes care plan with: ? Your work or school. ? People you live with.  Check your pee (urine) for ketones: ? When you are sick. ? As told by your doctor.  Carry a card or wear jewelry that says you have diabetes. Contact a doctor if:  You have trouble keeping your blood sugar in your target range.  You have low blood sugar often. Get help right away  if:  You still have symptoms after you eat or drink something sugary.  Your blood sugar is at or below 54 mg/dL (3 mmol/L).  You have jerky movements that you cannot control.  You pass out. These symptoms may be an emergency. Do not wait to see if the symptoms will go away. Get medical help right away. Call your local emergency services (911 in the [place] Do not drive yourself to the hospital. Summary  Hypoglycemia happens when the level of sugar (glucose) in your blood is too low.  Low blood sugar can happen to people who have diabetes and people who do not have diabetes. Low blood sugar can happen quickly, and it can be an emergency.  Make sure you know the signs of low blood sugar and know how to treat it.  Always keep a source of sugar (fast-acting carb) with you to treat low blood sugar. This information is not intended to replace advice given to you by your health care provider. Make sure you discuss  any questions you have with your health care provider. Document Revised: 07/28/2018 Document Reviewed: 05/10/2015 Elsevier Patient Education  [place].  Hyperglycemia Hyperglycemia occurs when the level of sugar (glucose) in the blood is too high. Glucose is a type of sugar that provides the body's main source of energy. Certain hormones (insulin and glucagon) control the level of glucose in the blood. Insulin lowers blood glucose, and glucagon increases blood glucose. Hyperglycemia can result from having too little insulin in the bloodstream, or from the body not responding normally to insulin. Hyperglycemia occurs most often in people who have diabetes (diabetes mellitus), but it can happen in people who do not have diabetes. It can develop quickly, and it can be life-threatening if it causes you to become severely dehydrated (diabetic ketoacidosis or hyperglycemic hyperosmolar state). Severe hyperglycemia is a medical emergency. What are the causes? If you have diabetes, hyperglycemia may be caused by:  Diabetes medicine.  Medicines that increase blood glucose or affect your diabetes control.  Not eating enough, or not eating often enough.  Changes in physical activity level.  Being sick or having an infection. If you have prediabetes or undiagnosed diabetes:  Hyperglycemia may be caused by those conditions. If you do not have diabetes, hyperglycemia may be caused by:  Certain medicines, including steroid medicines, beta-blockers, epinephrine, and thiazide diuretics.  Stress.  Serious illness.  Surgery.  Diseases of the pancreas.  Infection. What increases the risk? Hyperglycemia is more likely to develop in people who have risk factors for diabetes, such as:  Having a family member with diabetes.  Having a gene for type 1 diabetes that is passed from parent to child (inherited).  Living in an area with cold weather conditions.  Exposure to certain  viruses.  Certain conditions in which the body's disease-fighting (immune) system attacks itself (autoimmune disorders).  Being overweight or obese.  Having an inactive (sedentary) lifestyle.  Having been diagnosed with insulin resistance.  Having a history of prediabetes, gestational diabetes, or polycystic ovarian syndrome (PCOS).  Being of American-Indian, African-American, Hispanic/Latino, or Asian/Pacific Islander descent. What are the signs or symptoms? Hyperglycemia may not cause any symptoms. If you do have symptoms, they may include early warning signs, such as:  Increased thirst.  Hunger.  Feeling very tired.  Needing to urinate more often than usual.  Blurry vision. Other symptoms may develop if hyperglycemia gets worse, such as:  Dry mouth.  Loss of appetite.  Fruity-smelling breath.  Weakness.  Unexpected or rapid weight gain or weight loss.  Tingling or numbness in the hands or feet.  Headache.  Skin that does not quickly return to normal after being lightly pinched and released (poor skin turgor).  Abdominal pain.  Cuts or bruises that are slow to heal. How is this diagnosed? Hyperglycemia is diagnosed with a blood test to measure your blood glucose level. This blood test is usually done while you are having symptoms. Your health care provider may also do a physical exam and review your medical history. You may have more tests to determine the cause of your hyperglycemia, such as:  A fasting blood glucose (FBG) test. You will not be allowed to eat (you will fast) for at least 8 hours before a blood sample is taken.  An A1c (hemoglobin A1c) blood test. This provides information about blood glucose control over the previous 2-3 months.  An oral glucose tolerance test (OGTT). This measures your blood glucose at two times: ? After fasting. This is your baseline blood glucose level. ? Two hours after drinking a beverage that contains glucose. How is  this treated? Treatment depends on the cause of your hyperglycemia. Treatment may include:  Taking medicine to regulate your blood glucose levels. If you take insulin or other diabetes medicines, your medicine or dosage may be adjusted.  Lifestyle changes, such as exercising more, eating healthier foods, or losing weight.  Treating an illness or infection, if this caused your hyperglycemia.  Checking your blood glucose more often.  Stopping or reducing steroid medicines, if these caused your hyperglycemia. If your hyperglycemia becomes severe and it results in hyperglycemic hyperosmolar state, you must be hospitalized and given IV fluids. Follow these instructions at home:  General instructions  Take over-the-counter and prescription medicines only as told by your health care provider.  Do not use any products that contain nicotine or tobacco, such as cigarettes and e-cigarettes. If you need help quitting, ask your health care provider.  Limit alcohol intake to no more than 1 drink per day for nonpregnant women and 2 drinks per day for men. One drink equals 12 oz of beer, 5 oz of wine, or 1 oz of hard liquor.  Learn to manage stress. If you need help with this, ask your health care provider.  Keep all follow-up visits as told by your health care provider. This is important. Eating and drinking   Maintain a healthy weight.  Exercise regularly, as directed by your health care provider.  Stay hydrated, especially when you exercise, get sick, or spend time in hot temperatures.  Eat healthy foods, such as: ? Lean proteins. ? Complex carbohydrates. ? Fresh fruits and vegetables. ? Low-fat dairy products. ? Healthy fats.  Drink enough fluid to keep your urine clear or pale yellow. If you have diabetes:  Make sure you know the symptoms of hyperglycemia.  Follow your diabetes management plan, as told by your health care provider. Make sure you: ? Take your insulin and  medicines as directed. ? Follow your exercise plan. ? Follow your meal plan. Eat on time, and do not skip meals. ? Check your blood glucose as often as directed. Make sure to check your blood glucose before and after exercise. If you exercise longer or in a different way than usual, check your blood glucose more often. ? Follow your sick day plan whenever you cannot eat or drink normally. Make this plan in advance with your health care provider.  Share your diabetes  management plan with people in your workplace, school, and household.  Check your urine for ketones when you are ill and as told by your health care provider.  Carry a medical alert card or wear medical alert jewelry. Contact a health care provider if:  Your blood glucose is at or above 240 mg/dL (13.3 mmol/L) for 2 days in a row.  You have problems keeping your blood glucose in your target range.  You have frequent episodes of hyperglycemia. Get help right away if:  You have difficulty breathing.  You have a change in how you think, feel, or act (mental status).  You have nausea or vomiting that does not go away. These symptoms may represent a serious problem that is an emergency. Do not wait to see if the symptoms will go away. Get medical help right away. Call your local emergency services (911 in the [place] Do not drive yourself to the hospital. Summary  Hyperglycemia occurs when the level of sugar (glucose) in the blood is too high.  Hyperglycemia is diagnosed with a blood test to measure your blood glucose level. This blood test is usually done while you are having symptoms. Your health care provider may also do a physical exam and review your medical history.  If you have diabetes, follow your diabetes management plan as told by your health care provider.  Contact your health care provider if you have problems keeping your blood glucose in your target range. This information is not intended to replace advice  given to you by your health care provider. Make sure you discuss any questions you have with your health care provider. Document Revised: 12/23/2015 Document Reviewed: 12/23/2015 Elsevier Patient Education  [place].  Hemoglobin A1c Test Why am I having this test? You may have the hemoglobin A1c test (HbA1c test) done to:  Evaluate your risk for developing diabetes (diabetes mellitus).  Diagnose diabetes.  Monitor long-term control of blood sugar (glucose) in people who have diabetes and help make treatment decisions. This test may be done with other blood glucose tests, such as fasting blood glucose and oral glucose tolerance tests. What is being tested? Hemoglobin is a type of protein in the blood that carries oxygen. Glucose attaches to hemoglobin to form glycated hemoglobin. This test checks the amount of glycated hemoglobin in your blood, which is a good indicator of the average amount of glucose in your blood during the past 2-3 months. What kind of sample is taken?  A blood sample is required for this test. It is usually collected by inserting a needle into a blood vessel. Tell a health care provider about:  All medicines you are taking, including vitamins, herbs, eye drops, creams, and over-the-counter medicines.  Any blood disorders you have.  Any surgeries you have had.  Any medical conditions you have.  Whether you are pregnant or may be pregnant. How are the results reported? Your results will be reported as a percentage that indicates how much of your hemoglobin has glucose attached to it (is glycated). Your health care provider will compare your results to normal ranges that were established after testing a large group of people (reference ranges). Reference ranges may vary among labs and hospitals. For this test, common reference ranges are:  Adult or child without diabetes: 4-5.6%.  Adult or child with diabetes and good blood glucose control: less than  7%. What do the results mean? If you have diabetes:  A result of less than 7% is considered normal,  meaning that your blood glucose is well controlled.  A result higher than 7% means that your blood glucose is not well controlled, and your treatment plan may need to be adjusted. If you do not have diabetes:  A result within the reference range is considered normal, meaning that you are not at high risk for diabetes.  A result of 5.7-6.4% means that you have a high risk of developing diabetes, and you may have prediabetes. Prediabetes is the condition of having a blood glucose level that is higher than it should be, but not high enough for you to be diagnosed with diabetes. Having prediabetes puts you at risk for developing type 2 diabetes (type 2 diabetes mellitus). You may have more tests, including a repeat HbA1c test.  Results of 6.5% or higher on two separate HbA1c tests mean that you have diabetes. You may have more tests to confirm the diagnosis. Abnormally low HbA1c values may be caused by:  Pregnancy.  Severe blood loss.  Receiving donated blood (transfusions).  Low red blood cell count (anemia).  Long-term kidney failure.  Some unusual forms (variants) of hemoglobin. Talk with your health care provider about what your results mean. Questions to ask your health care provider Ask your health care provider, or the department that is doing the test:  When will my results be ready?  How will I get my results?  What are my treatment options?  What other tests do I need?  What are my next steps? Summary  The hemoglobin A1c test (HbA1c test) may be done to evaluate your risk for developing diabetes, to diagnose diabetes, and to monitor long-term control of blood sugar (glucose) in people who have diabetes and help make treatment decisions.  Hemoglobin is a type of protein in the blood that carries oxygen. Glucose attaches to hemoglobin to form glycated hemoglobin. This  test checks the amount of glycated hemoglobin in your blood, which is a good indicator of the average amount of glucose in your blood during the past 2-3 months.  Talk with your health care provider about what your results mean. This information is not intended to replace advice given to you by your health care provider. Make sure you discuss any questions you have with your health care provider. Document Revised: 03/19/2017 Document Reviewed: 11/17/2016 Elsevier Patient Education  2020 [facility] [phone]) 1. Dr. Philemon Kingdom 2. Dr. [facility] [phone]) 1. Dr. Delrae Rend*** [place] [phone]) 1. Dr. Jacelyn Pi 2. Dr. Anda Kraft [facility] [phone]) 1. [facility]. [facility] [phone]) [[place] office]  [phone]) [[place] office] 1. Dr. Lenna Sciara [place] 2. Dr. Mee Hives [facility]) [phone]) 1. Autumn Hudnall Ronnald Ramp), PA 2. Dr. Amalia Greenhouse 3. Dr. Marsh Dolly. [facility] [phone]) 1. Dr. Glade Lloyd [facility] [phone]) 1. Dr. Orville Govern 2. Dr. Lelon Huh 3. Dr. Jerelene Redden 4. Alwyn Ren, FNP Dr. Carolynn Serve. [place] [phone])

## 2019-04-27 NOTE — Progress Notes (Signed)
Inpatient Diabetes Program Recommendations  AACE/ADA: New Consensus Statement on Inpatient Glycemic Control (2015)  Target Ranges:  Prepandial:   less than 140 mg/dL      Peak postprandial:   less than 180 mg/dL (1-2 hours)      Critically ill patients:  140 - 180 mg/dL   Lab Results  Component Value Date   GLUCAP 258 (H) 04/27/2019   HGBA1C >18.5 (H) 04/27/2019    Review of Glycemic Control Results for Gary Stewart, Gary Stewart (MRN [id number]) as of 04/27/2019 14:19  Ref. Range 04/27/2019 06:25 04/27/2019 07:47 04/27/2019 12:19  Glucose-Capillary Latest Ref Range: 70 - 99 mg/dL 123 (H) 181 (H) 258 (H)    Diabetes history: New onset Outpatient Diabetes medications: none Current orders for Inpatient glycemic control: Novolog 3 units TID, Novolog 0-15 units TID & HS, Lantus 20 units QHS  Inpatient Diabetes Program Recommendations:    Noted order changes, would continue with plan for discharge. Awaiting care management for benefits check.   Spoke with patient regarding new onset diabetes.   Reviewed patient's current A1c of >18.5%. Explained what a A1c is and what it measures. Also reviewed goal A1c with patient, importance of good glucose control @ home, and blood sugar goals. Reviewed patho of DM, need for insulin, role of pancreas, survival skills, signs and symptoms of hypo vs hyper glycemia, checking blood sugars, CHO intake, vascular changes and comorbidites.  Patient will need a meter at discharge. MD placed on DC summary. Additionally, discussed Freestyle Libre with patient. Reviewed benefits, cost, application, using device as a tool and when to compare with fingerstick. Encouraged to take device with him to next appointment.  Reviewed mindfulness of CHO intake, alternatives, importance of eliminating sugary beverages, plate method and goal setting. Patient reports doing well with meal planning and asking appropriate questions.  Educated patient and spouse on insulin pen use at home.  Reviewed contents of insulin flexpen starter kit. Reviewed all steps if insulin pen including attachment of needle, 2-unit air shot, dialing up dose, giving injection, removing needle, disposal of sharps, storage of unused insulin, disposal of insulin etc. Patient able to provide successful return demonstration. Also reviewed troubleshooting with insulin pen. MD to give patient Rxs for insulin pens and insulin pen needles. Reviewed target goals, when to call MD, interventions, and the importance of follow up. Plan to reach out to outpatient endocrinology, Will attach list to DC summary. Patient has no further questions at this time.  Discussed application of Freestyle Libre with Dr Lonny Prude. Orders provided. Sensor applied.   At discharge patient prefers insulin pens.  #[id number] insulin pen needles #[id number] meter  Recommending 20 units QHS of basal insulin and at least 4 units TID of short acting insulin (brands pending benefits check).   Thanks, Bronson Curb, MSN, RNC-OB Diabetes Coordinator [phone] (8a-5p)

## 2019-04-27 NOTE — Plan of Care (Signed)

## 2019-04-27 NOTE — Discharge Summary (Addendum)
Physician Discharge Summary  Gary Stewart KGM:[id number] DOB: 1955-06-22 DOA: 04/25/2019  PCP: Cari Caraway, MD  Admit date: 04/25/2019 Discharge date: 04/27/2019  Admitted From: Home Disposition: Home  Recommendations for Outpatient Follow-up:  1. Follow up with PCP in 1 week 2. Please obtain BMP/CBC in one week 3. Please follow up on the following pending results: None  Home Health: None Equipment/Devices: None (Patient declines AFO)  Discharge Condition: Stable CODE STATUS: Full code Diet recommendation: Carb modified   Brief/Interim Summary:  Admission HPI written by Jani Gravel, MD    HPI:   Gary Stewart  is a 64 y.o. 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   Hospital course:  Diabetic ketoacidosis Patient presented with a glucose of 1076 with an associated CO2 of 19 and a anion gap of 20.  Hemoglobin A1c from PCPs office of greater than 16.9%.  Hemoglobin A1c obtained during admission was greater than 18.5%.  Patient was started on IV insulin with eventual closure of gap in addition to improvement of CO2.  Patient transition to Lantus 20 units daily in addition to sliding scale insulin.  Diabetic coordinator was consulted for patient teaching.  Patient discharged on Lantus 20 units daily in addition to NovoLog 4 units 3 times daily with meals.  Left foot drop Unknown etiology.  Physical therapy consulted. Patient declining AFO. Recommend outpatient follow-up with MRI.  Bilateral leg swelling Appears to be chronic. No heart disease but patient does not have good medical follow-up.  Recommend continued outpatient management/follow-up.  Elevated ferritin Iron panel suggests chronic disease likely secondary to chronic hyperglycemia. No associated anemia hepatic function panel is normal. Outpatient follow-up.   Discharge Diagnoses:  Principal Problem:   Diabetic  ketoacidosis ([place]) Active Problems:   Weight loss   Left foot drop    Discharge Instructions  Discharge Instructions    Ambulatory referral to Nutrition and Diabetic Education   Complete by: As directed      Allergies as of 04/27/2019      Reactions   Penicillins Rash   Did it involve swelling of the face/tongue/throat, SOB, or low BP? No Did it involve sudden or severe rash/hives, skin peeling, or any reaction on the inside of your mouth or nose? Yes Did you need to seek medical attention at a hospital or doctor's office? Yes When did it last happen?2000 If all above answers are "NO", may proceed with cephalosporin use.      Medication List    STOP taking these medications   Opcon-A 0.027-0.315 % Soln Generic drug: Naphazoline-Pheniramine     TAKE these medications   blood glucose meter kit and supplies Dispense based on patient and insurance preference. Use up to four times daily as directed. (FOR ICD-10 E10.9, E11.9).   Insulin Pen Needle 31G X 5 MM Misc BD Pen Needles- brand specific   Lantus SoloStar 100 UNIT/ML Solostar Pen Generic drug: Insulin Glargine Inject 20 Units into the skin at bedtime.   NovoLOG FlexPen 100 UNIT/ML FlexPen Generic drug: insulin aspart Inject 4 Units into the skin 3 (three) times daily with meals.      Follow-up Information    Cari Caraway, MD. Go on 05/08/2019.   Specialty: Family Medicine Why: _0 :[place] Hidden information: [place] [address] [phone]          Allergies  Allergen Reactions  . Penicillins Rash  Did it involve swelling of the face/tongue/throat, SOB, or low BP? No Did it involve sudden or severe rash/hives, skin peeling, or any reaction on the inside of your mouth or nose? Yes Did you need to seek medical attention at a hospital or doctor's office? Yes When did it last happen?2000 If all above answers are "NO", may proceed with cephalosporin use.      Consultations:  None   Procedures/Studies: DG Chest 2 View  Result Date: 04/26/2019 CLINICAL DATA:  Hyperglycemia at PCP office EXAM: CHEST - 2 VIEW COMPARISON:  None. FINDINGS: No consolidation, features of edema, pneumothorax, or effusion. Pulmonary vascularity is normally distributed. The cardiomediastinal contours are unremarkable. No acute osseous or soft tissue abnormality. Degenerative changes are present in the imaged spine and shoulders. IMPRESSION: No acute cardiopulmonary abnormality. Electronically Signed   By: Lovena Le M.D.   On: 04/26/2019 03:29   CT ABDOMEN PELVIS W CONTRAST  Result Date: 04/26/2019 CLINICAL DATA:  Unintended weight loss, 65 pounds in 2 weeks per patient EXAM: CT ABDOMEN AND PELVIS WITH CONTRAST TECHNIQUE: Multidetector CT imaging of the abdomen and pelvis was performed using the standard protocol following bolus administration of intravenous contrast. CONTRAST:  136m OMNIPAQUE IOHEXOL 300 MG/ML  SOLN COMPARISON:  None FINDINGS: Lower chest: Small air cyst in the right lung base. Minimal atelectatic change. Normal heart size. No pericardial effusion. Hepatobiliary: Subcentimeter hypoattenuating focus along the posterior left lobe liver (3/21) is too small to fully characterize on CT imaging but statistically likely benign. No worrisome hepatic lesions, no gallstones, gallbladder wall thickening, or biliary dilatation. Pancreas: Unremarkable. No pancreatic ductal dilatation or surrounding inflammatory changes. Spleen: Normal in size without focal abnormality. Adrenals/Urinary Tract: Adrenal glands are unremarkable. Kidneys are normal, without renal calculi, focal lesion, or hydronephrosis. There is focal anterior bladder wall thickening. Stomach/Bowel: Distal esophagus, stomach and duodenal sweep are unremarkable. No small bowel wall thickening or dilatation. No evidence of obstruction. A normal appendix is visualized. No colonic dilatation or wall thickening.  Scattered colonic diverticula without focal pericolonic inflammation to suggest diverticulitis. Vascular/Lymphatic: Atherosclerotic plaque within the normal caliber aorta. No suspicious or enlarged lymph nodes in the included lymphatic chains. Reproductive: Coarse likely benign calcification of the prostate. No concerning abnormalities of the prostate or seminal vesicles. Other: Evidence of prior ventral hernia repair (3/54). No bowel containing hernias. No free air or fluid in the abdomen or pelvis. Musculoskeletal: Multilevel degenerative changes are present in the imaged portions of the spine. No acute osseous abnormality or suspicious osseous lesion. IMPRESSION: 1. Focal anterior bladder wall thickening. Correlate with urinalysis to exclude cystitis and consider further evaluation with cystoscopy. 2. Colonic diverticulosis without evidence of acute diverticulitis. 3. Evidence of prior ventral hernia repair. Small fat containing bilateral inguinal hernias. 4.  Aortic Atherosclerosis (ICD10-I70.0). Electronically Signed   By: PLovena LeM.D.   On: 04/26/2019 03:04      Subjective: No issues overnight  Discharge Exam: Vitals:   04/27/19 0339 04/27/19 1000  BP: 112/72 106/60  Pulse: (!) 53 71  Resp: 20 19  Temp: 98.6 F (37 C) 98 F (36.7 C)  SpO2: 97% 97%   Vitals:   04/26/19 2200 04/27/19 0028 04/27/19 0339 04/27/19 1000  BP: 107/67 121/74 112/72 106/60  Pulse: 65 63 (!) 53 71  Resp: _0 Temp:  98.6 F (37 C) 98.6 F (37 C) 98 F (36.7 C)  TempSrc:  Oral Oral Oral  SpO2: 98% 100% 97% 97%  Weight:  68.4 kg    Height:  6' (1.829 m)      General: Pt is alert, awake, not in acute distress Cardiovascular: RRR, S1/S2 +, no rubs, no gallops Respiratory: CTA bilaterally, no wheezing, no rhonchi Abdominal: Soft, NT, ND, bowel sounds + Extremities: no edema, no cyanosis    The results of significant diagnostics from this hospitalization (including imaging, microbiology,  ancillary and laboratory) are listed below for reference.     Microbiology: Recent Results (from the past 240 hour(s))  SARS CORONAVIRUS 2 (TAT 6-24 HRS) Nasopharyngeal Nasopharyngeal Swab     Status: None   Collection Time: 04/26/19  2:59 AM   Specimen: Nasopharyngeal Swab  Result Value Ref Range Status   SARS Coronavirus 2 NEGATIVE NEGATIVE Final    Comment: (NOTE) SARS-CoV-2 target nucleic acids are NOT DETECTED. The SARS-CoV-2 RNA is generally detectable in upper and lower respiratory specimens during the acute phase of infection. Negative results do not preclude SARS-CoV-2 infection, do not rule out co-infections with other pathogens, and should not be used as the sole basis for treatment or other patient management decisions. Negative results must be combined with clinical observations, patient history, and epidemiological information. The expected result is Negative. Fact Sheet for Patients: SugarRoll.be Fact Sheet for Healthcare Providers: https://www.woods-mathews.com/ This test is not yet approved or cleared by the [place] FDA and  has been authorized for detection and/or diagnosis of SARS-CoV-2 by FDA under an Emergency Use Authorization (EUA). This EUA will remain  in effect (meaning this test can be used) for the duration of the COVID-19 declaration under Section 56 4(b)(1) of the Act, 21 U.S.C. section 360bbb-3(b)(1), unless the authorization is terminated or revoked sooner. Performed at [facility], [place] [address] [place], [place] [address]      Labs: BNP (last 3 results) No results for input(s): BNP in the last 8760 hours. Basic Metabolic Panel: Recent Labs  Lab 04/26/19 0550 04/26/19 0941 04/26/19 1313 04/26/19 1750 04/27/19 0956  NA 135 138 137 138 135  K 3.8 3.3* 3.3* 3.2* 3.7  CL 99 101 101 103 101  CO2 21* _0 GLUCOSE 321* 216* 198* 165* 297*  BUN _1 CREATININE 0.99 0.74  0.67 0.60* 0.78  CALCIUM 9.0 8.8* 8.5* 8.9 8.4*   Liver Function Tests: Recent Labs  Lab 04/26/19 1313  AST 20  ALT 32  ALKPHOS 49  BILITOT 1.0  PROT 5.2*  ALBUMIN 2.8*   No results for input(s): LIPASE, AMYLASE in the last 168 hours. No results for input(s): AMMONIA in the last 168 hours. CBC: Recent Labs  Lab 04/25/19 2123 04/26/19 0102  WBC 7.5  --   HGB 13.5 15.6  HCT 40.2 46.0  MCV 98.0  --   PLT 225  --    Cardiac Enzymes: No results for input(s): CKTOTAL, CKMB, CKMBINDEX, TROPONINI in the last 168 hours. BNP: Invalid input(s): POCBNP CBG: Recent Labs  Lab 04/27/19 0339 04/27/19 0425 04/27/19 0625 04/27/19 0747 04/27/19 1219  GLUCAP 144* 118* 123* 181* 258*   D-Dimer No results for input(s): DDIMER in the last 72 hours. Hgb A1c Recent Labs    04/27/19 0550  HGBA1C >18.5*   Lipid Profile No results for input(s): CHOL, HDL, LDLCALC, TRIG, CHOLHDL, LDLDIRECT in the last 72 hours. Thyroid function studies Recent Labs    04/26/19 0544  TSH 0.922   Anemia work up Recent Labs    04/26/19 [place] 985*  TIBC 224*  IRON 65   Urinalysis    Component Value Date/Time   COLORURINE COLORLESS (A) 04/25/2019 2117   APPEARANCEUR CLEAR 04/25/2019 2117   LABSPEC 1.025 04/25/2019 2117   PHURINE 5.0 04/25/2019 2117   GLUCOSEU >=500 (A) 04/25/2019 2117   HGBUR NEGATIVE 04/25/2019 2117   BILIRUBINUR NEGATIVE 04/25/2019 2117   KETONESUR 20 (A) 04/25/2019 2117   PROTEINUR NEGATIVE 04/25/2019 2117   NITRITE NEGATIVE 04/25/2019 2117   LEUKOCYTESUR NEGATIVE 04/25/2019 2117   Sepsis Labs Invalid input(s): PROCALCITONIN,  WBC,  LACTICIDVEN Microbiology Recent Results (from the past 240 hour(s))  SARS CORONAVIRUS 2 (TAT 6-24 HRS) Nasopharyngeal Nasopharyngeal Swab     Status: None   Collection Time: 04/26/19  2:59 AM   Specimen: Nasopharyngeal Swab  Result Value Ref Range Status   SARS Coronavirus 2 NEGATIVE NEGATIVE Final    Comment:  (NOTE) SARS-CoV-2 target nucleic acids are NOT DETECTED. The SARS-CoV-2 RNA is generally detectable in upper and lower respiratory specimens during the acute phase of infection. Negative results do not preclude SARS-CoV-2 infection, do not rule out co-infections with other pathogens, and should not be used as the sole basis for treatment or other patient management decisions. Negative results must be combined with clinical observations, patient history, and epidemiological information. The expected result is Negative. Fact Sheet for Patients: SugarRoll.be Fact Sheet for Healthcare Providers: https://www.woods-mathews.com/ This test is not yet approved or cleared by the [place] FDA and  has been authorized for detection and/or diagnosis of SARS-CoV-2 by FDA under an Emergency Use Authorization (EUA). This EUA will remain  in effect (meaning this test can be used) for the duration of the COVID-19 declaration under Section 56 4(b)(1) of the Act, 21 U.S.C. section 360bbb-3(b)(1), unless the authorization is terminated or revoked sooner. Performed at [facility], [place] [address] [place], [place] [address]      Time coordinating discharge: 35 minutes  SIGNED:   Cordelia Poche, MD [facility] 04/27/2019, 12:23 PM

## 2019-04-27 NOTE — Plan of Care (Signed)
Nutrition Education Note  RD consulted for nutrition education regarding diabetes.  Spoke with pt and his wife at bedside. Pt reports having just spoken with Diabetes Coordinator.  Pt reports that for the last few days PTA, he was drinking a lot of orange juice and soda. Pt reports that he typically drinks these beverages but was drinking them in excess. Pt also reports struggling with portion sizes PTA.  Lab Results  Component Value Date   HGBA1C >18.5 (H) 04/27/2019    RD provided "Carbohydrate Counting for People with Diabetes" handout from the Academy of Nutrition and Dietetics. Discussed different food groups and their effects on blood sugar, emphasizing carbohydrate-containing foods. Provided list of carbohydrates and recommended serving sizes of common foods.  Discussed importance of controlled and consistent carbohydrate intake throughout the day. Provided examples of ways to balance meals/snacks and encouraged intake of high-fiber, whole grain complex carbohydrates. Teach back method used.  Expect good compliance.  Body mass index is 20.47 kg/m. Pt meets criteria for normal weight based on current BMI.  Current diet order is Carb Modified, patient is consuming approximately 100% of meals at this time. Labs and medications reviewed. No further nutrition interventions warranted at this time. RD contact information provided. If additional nutrition issues arise, please re-consult RD.   Earma Reading, MS, RD, LDN Inpatient Clinical Dietitian Pager: [phone] Weekend/After Hours: [phone]

## 2019-05-03 ENCOUNTER — Encounter: Payer: Self-pay | Admitting: Neurology

## 2019-05-08 DIAGNOSIS — M21372 Foot drop, left foot: Secondary | ICD-10-CM | POA: Diagnosis not present

## 2019-05-08 DIAGNOSIS — E109 Type 1 diabetes mellitus without complications: Secondary | ICD-10-CM | POA: Diagnosis not present

## 2019-05-08 DIAGNOSIS — N3289 Other specified disorders of bladder: Secondary | ICD-10-CM | POA: Diagnosis not present

## 2019-05-08 DIAGNOSIS — I700 Atherosclerosis of aorta: Secondary | ICD-10-CM | POA: Diagnosis not present

## 2019-05-11 DIAGNOSIS — E109 Type 1 diabetes mellitus without complications: Secondary | ICD-10-CM | POA: Diagnosis not present

## 2019-05-29 ENCOUNTER — Other Ambulatory Visit: Payer: Self-pay

## 2019-05-29 ENCOUNTER — Encounter: Payer: Self-pay | Admitting: Neurology

## 2019-05-29 ENCOUNTER — Ambulatory Visit (INDEPENDENT_AMBULATORY_CARE_PROVIDER_SITE_OTHER): Payer: BC Managed Care – PPO | Admitting: Neurology

## 2019-05-29 VITALS — BP 130/77 | HR 82 | Resp 18 | Ht 72.0 in | Wt 174.0 lb

## 2019-05-29 DIAGNOSIS — G5702 Lesion of sciatic nerve, left lower limb: Secondary | ICD-10-CM

## 2019-05-29 DIAGNOSIS — M21372 Foot drop, left foot: Secondary | ICD-10-CM | POA: Diagnosis not present

## 2019-05-29 NOTE — Patient Instructions (Addendum)
Referral to physical therapy  Please call my office if you would like to proceed with nerve testing or need a foot brace, or your symptoms get worse.

## 2019-05-29 NOTE — Progress Notes (Signed)
[facility] Clinic Note - Initial Visit   Date: 05/29/19  CARMICHAEL BURDETTE MRN: [id number] DOB: 02/16/1956   Dear Dr. Addison Lank:  Thank you for your kind referral of Gary Stewart for consultation of left foot drop. Although his history is well known to you, please allow [name] to reiterate it for the purpose of our medical record. The patient was accompanied to the clinic by self.    History of Present Illness: Gary Stewart is a 64 y.o. 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   Starting around October, he developed sudden onset of left foot weakness.  He also has numbness over the bottom of the left foot.  He denies back pain or similar problems in the right leg. Around the same time, he reports loosing his sense of smell and taste and lost 60 lb weight over two weeks.  He did not seek medical attention to get tested for COVID.  In January 2021, he went for annual physical with his PCP and on routine labs, he was noted to have severe hyperglycemia and DKA and admitted for management. Hemoglobin A1c was significantly elevated at > 18.5.  He is now on insulin and blood sugars are staying within normal limits.  Over the past month, a reports having mild improvement.  He denies crossing his legs.  No history of heavy alcohol use.    Out-side paper records, electronic medical record, and images have been reviewed where available and summarized as:  Lab Results  Component Value Date   HGBA1C >18.5 (H) 04/27/2019   No results found for: [id number] Lab Results  Component Value Date   TSH 0.922 04/26/2019   No results found for: ESRSEDRATE, POCTSEDRATE  Past Medical History:  Diagnosis Date  . Diabetes ([place])   . Hyperlipidemia   . Wears glasses     Past Surgical History:  Procedure Laterality Date  . EPIGASTRIC HERNIA REPAIR N/A 07/10/2014   Procedure: INCARCERATED EPIGASTRIC HERNIA REPAIR WITH MESH;  Surgeon: Armandina Gemma, MD;  Location: [facility];  Service: General;  Laterality: N/A;  . 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 WISDOM TOOTH EXTRACTION       Medications:  Outpatient Encounter Medications as of 05/29/2019  Medication Sig  . atorvastatin (LIPITOR) 20 MG tablet Take 20 mg by mouth daily.  . blood glucose meter kit and supplies Dispense based on patient and insurance preference. Use up to four times daily as directed. (FOR ICD-10 E10.9, E11.9).  . insulin aspart (NOVOLOG FLEXPEN) 100 UNIT/ML FlexPen Inject 4 Units into the skin 3 (three) times daily with meals.  . Insulin Glargine (LANTUS SOLOSTAR) 100 UNIT/ML Solostar Pen Inject 20 Units into the skin at bedtime.  . Insulin Pen Needle 31G X 5 MM MISC BD Pen Needles- brand specific   No facility-administered encounter medications on file as of 05/29/2019.    Allergies:  Allergies  Allergen Reactions  . Penicillins Rash    Did it involve swelling of the face/tongue/throat, SOB, or low BP? No Did it involve sudden or severe rash/hives, skin peeling, or any reaction on the inside of your mouth or nose? Yes Did you need to seek medical attention at a hospital or doctor's office? Yes When did it last happen?2000 If all above answers are "NO", may proceed with cephalosporin use.     Family History: Family History  Problem Relation  Age of Onset  . Parkinson's disease Father     Social History: Social History   Tobacco Use  . Smoking status: Never Smoker  . Smokeless tobacco: Never Used  Substance Use Topics  . Alcohol use: Yes    Comment: occ  . Drug use: No   Social History   Social History Narrative   Right handed   Two story home   Drinks caffeine    Vital Signs:  BP 130/77   Pulse 82   Resp 18   Ht 6' (1.829 m)   Wt 174 lb (78.9 kg)   SpO2 98%   BMI 23.60 kg/m    Neurological Exam: MENTAL STATUS including  orientation to time, place, person, recent and remote memory, attention span and concentration, language, and fund of knowledge is normal.  Speech is not dysarthric.  CRANIAL NERVES: II:  No visual field defects.   III-IV-VI: Pupils equal round and reactive to light.  Normal conjugate, extra-ocular eye movements in all directions of gaze.  No nystagmus.  No ptosis.   V:  Normal facial sensation.    VII:  Normal facial symmetry and movements.   VIII:  Normal hearing and vestibular function.   IX-X:  Normal palatal movement.   XI:  Normal shoulder shrug and head rotation.   XII:  Normal tongue strength and range of motion, no deviation or fasciculation.  MOTOR:  Generalized loss of muscle bulk in the legs. No fasciculations or abnormal movements.  No pronator drift.   Upper Extremity:  Right  Left  Deltoid  5/5   5/5   Biceps  5/5   5/5   Triceps  5/5   5/5   Infraspinatus 5/5  5/5  Medial pectoralis 5/5  5/5  Wrist extensors  5/5   5/5   Wrist flexors  5/5   5/5   Finger extensors  5/5   5/5   Finger flexors  5/5   5/5   Dorsal interossei  5/5   5/5   Abductor pollicis  5/5   5/5   Tone (Ashworth scale)  0  0   Lower Extremity:  Right  Left  Hip flexors  5/5   5/5   Hip extensors  5/5   5/5   Adductor 5/5  5/5  Abductor 5/5  5/5  Knee flexors  5/5   5/5   Knee extensors  5/5   5/5   Inversion 5/5  5/5  Eversion 5/5  4/5  Dorsiflexors  5/5   4/5   Plantarflexors  5/5   5/5   Toe extensors  5/5   4/5   Toe flexors  5/5   5/5   Tone (Ashworth scale)  0  0   MSRs:  Right        Left                  brachioradialis 2+  2+  biceps 2+  2+  triceps 2+  2+  patellar 2+  2+  ankle jerk 0  0  Hoffman no  no  plantar response down  down   SENSORY:  Normal and symmetric perception of light touch, pinprick, vibration, and proprioception.    COORDINATION/GAIT: Normal finger-to- nose-finger.  High steppage gait on the left, stable and unassisted.  He is unable to stand on heels  on the left.  Toe walking intact.  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 Exam shows isolated weakness of peroneal-innervated muscles, however,  it is not typical to have numbness over the base of the toes. I offered EDX to be sure there is no overlapping neuropathy or radiculopathy, however he declined.  I also offered AFO to help with gait and prevent falls, which he declined.  He was agreeable to start physical therapy.  I stressed the importance of tight diabetes management.  He may consider testing, if symptoms get worse.    Thank you for allowing me to participate in patient's care.  If I can answer any additional questions, I would be pleased to do so.    Sincerely,    Tiarah Shisler K. Posey Pronto, DO

## 2019-06-02 DIAGNOSIS — N5201 Erectile dysfunction due to arterial insufficiency: Secondary | ICD-10-CM | POA: Diagnosis not present

## 2019-06-02 DIAGNOSIS — R9341 Abnormal radiologic findings on diagnostic imaging of renal pelvis, ureter, or bladder: Secondary | ICD-10-CM | POA: Diagnosis not present

## 2019-06-12 ENCOUNTER — Encounter: Payer: BC Managed Care – PPO | Attending: Family Medicine | Admitting: Dietician

## 2019-06-12 ENCOUNTER — Other Ambulatory Visit: Payer: Self-pay

## 2019-06-12 DIAGNOSIS — E1069 Type 1 diabetes mellitus with other specified complication: Secondary | ICD-10-CM | POA: Diagnosis not present

## 2019-06-12 NOTE — Patient Instructions (Signed)
Begin counting carbohydrates. Continue to stay active. Watch your blood sugar trend on your [place].  Are you having lows? What effect does exercise have on your blood sugar trend? Avoid snacking unless they are low carbohydrate (jerky, nuts, raw vegetables)  Continue to check your feet daily. Have a small amount of protein with each meal.

## 2019-06-12 NOTE — Progress Notes (Signed)
Diabetes Self-Management Education  Visit Type: First/Initial  Appt. Start Time: 1110 Appt. End Time: 1240  06/18/2019  [name], identified by name and date of birth, is a 64 y.o. male with a diagnosis of Diabetes: Type 1.   ASSESSMENT Patient is here today with his wife.  Newly diagnosed Type 1 Diabetes 04/2018, foot drop A1C >18.5% 04/26/2018 GAD <5 06/13/19 Medication includes:  Novolog 4 units before meals, Lanuts 20 units q HS Uses FreeStyle Libre  Weight hx: 177 lbs 06/12/19 145 lbs 04/2019 205 lbs 11/2018 Developed covid like symptoms 12/2018 with loss of taste and smell (antibodies were negative 04/2019) Lost weight at that time and assumed it due to illness.  Patient lives with his wife and 2 daughters.  They share shopping and cooking. He is a retired Medical illustrator. He states that he just started to exercise again.  Daily for Cardio and 3 times per week with the weights. He stopped exercising in September when he had COVID.  He did cardio and weights at that time.    Height 6' (1.829 m), weight 177 lb (80.3 kg). Body mass index is 24.01 kg/m.  Diabetes Self-Management Education - 06/12/19 1140      Visit Information   Visit Type  First/Initial      Initial Visit   Diabetes Type  Type 1    Are you currently following a meal plan?  No    Are you taking your medications as prescribed?  Yes    Date Diagnosed  04/2019      Health Coping   How would you rate your overall health?  Good      Psychosocial Assessment   Patient Belief/Attitude about Diabetes  Motivated to manage diabetes    Self-care barriers  None    Self-management support  Doctor's office    Other persons present  Patient;Spouse/SO    Patient Concerns  Nutrition/Meal planning;Glycemic Control    Special Needs  None    Preferred Learning Style  No preference indicated    Learning Readiness  Ready    How often do you need to have someone help you when you read instructions, pamphlets, or other  written materials from your doctor or pharmacy?  1 - Never    What is the last grade level you completed in school?  college graduate      Pre-Education Assessment   Patient understands the diabetes disease and treatment process.  Needs Instruction    Patient understands incorporating nutritional management into lifestyle.  Needs Instruction    Patient undertands incorporating physical activity into lifestyle.  Needs Instruction    Patient understands using medications safely.  Needs Instruction    Patient understands monitoring blood glucose, interpreting and using results  Needs Instruction    Patient understands prevention, detection, and treatment of chronic complications.  Needs Instruction    Patient understands how to develop strategies to address psychosocial issues.  Needs Instruction    Patient understands how to develop strategies to promote health/change behavior.  Needs Instruction      Complications   Last HgB A1C per patient/outside source  18.5 %   04/2018   How often do you check your blood sugar?  > 4 times/day    Fasting Blood glucose range (mg/dL)  22-297    Postprandial Blood glucose range (mg/dL)  989-211    Number of hypoglycemic episodes per month  1    Can you tell when your blood sugar is low?  Yes  What do you do if your blood sugar is low?  took 1 glucose tablets.    Have you had a dilated eye exam in the past 12 months?  Yes    Have you had a dental exam in the past 12 months?  Yes    Are you checking your feet?  Yes    How many days per week are you checking your feet?  7      Dietary Intake   Breakfast  eggs, bacon ([place] or pork), tortilla    Snack (morning)  cashews, jerky    Lunch  salad with protein (cheese or shrimp or chicken)    Snack (afternoon)  cashews, jerky    Dinner  protein (steak, chicken, veal, etc.), salad or sweet potato or quino, vegetables    Snack (evening)  beef jerky, cashews, sugar free chocolate pudding    Beverage(s)  water,  tea, occasional black coffee, occasional diet coke, (no alcohol)      Exercise   Exercise Type  Moderate (swimming / aerobic walking)    How many days per week to you exercise?  3    How many minutes per day do you exercise?  45    Total minutes per week of exercise  135      Patient Education   Previous Diabetes Education  No    Disease state   Definition of diabetes, type 1 and 2, and the diagnosis of diabetes;Factors that contribute to the development of diabetes    Nutrition management   Role of diet in the treatment of diabetes and the relationship between the three main macronutrients and blood glucose level;Food label reading, portion sizes and measuring food.;Carbohydrate counting;Meal timing in regards to the patients' current diabetes medication.;Meal options for control of blood glucose level and chronic complications.;Effects of alcohol on blood glucose and safety factors with consumption of alcohol.    Physical activity and exercise   Role of exercise on diabetes management, blood pressure control and cardiac health.;Identified with patient nutritional and/or medication changes necessary with exercise.    Medications  Reviewed patients medication for diabetes, action, purpose, timing of dose and side effects.    Monitoring  Purpose and frequency of SMBG.;Identified appropriate SMBG and/or A1C goals.;Daily foot exams;Yearly dilated eye exam    Acute complications  Taught treatment of hypoglycemia - the 15 rule.    Chronic complications  Relationship between chronic complications and blood glucose control;Dental care;Retinopathy and reason for yearly dilated eye exams    Psychosocial adjustment  Identified and addressed patients feelings and concerns about diabetes;Role of stress on diabetes      Individualized Goals (developed by patient)   Nutrition  Follow meal plan discussed;Other (comment)   carbohydrate counting   Physical Activity  Exercise 5-7 days per week;30 minutes per day     Medications  take my medication as prescribed    Monitoring   test my blood glucose as discussed    Reducing Risk  increase portions of healthy fats;examine blood glucose patterns    Health Coping  discuss diabetes with (comment)   MD, RD, CDE     Post-Education Assessment   Patient understands the diabetes disease and treatment process.  Demonstrates understanding / competency    Patient understands incorporating nutritional management into lifestyle.  Demonstrates understanding / competency    Patient undertands incorporating physical activity into lifestyle.  Demonstrates understanding / competency    Patient understands using medications safely.  Demonstrates understanding / competency  Patient understands monitoring blood glucose, interpreting and using results  Demonstrates understanding / competency    Patient understands prevention, detection, and treatment of acute complications.  Demonstrates understanding / competency    Patient understands prevention, detection, and treatment of chronic complications.  Demonstrates understanding / competency    Patient understands how to develop strategies to address psychosocial issues.  Demonstrates understanding / competency    Patient understands how to develop strategies to promote health/change behavior.  Demonstrates understanding / competency      Outcomes   Expected Outcomes  Demonstrated interest in learning. Expect positive outcomes    Future DMSE  Yearly    Program Status  Completed       Individualized Plan for Diabetes Self-Management Training:   Learning Objective:  Patient will have a greater understanding of diabetes self-management. Patient education plan is to attend individual and/or group sessions per assessed needs and concerns.   Plan:   Patient Instructions  Begin counting carbohydrates. Continue to stay active. Watch your blood sugar trend on your [place].  Are you having lows? What effect does exercise have  on your blood sugar trend? Avoid snacking unless they are low carbohydrate (jerky, nuts, raw vegetables)  Continue to check your feet daily. Have a small amount of protein with each meal.    Expected Outcomes:  Demonstrated interest in learning. Expect positive outcomes  Education material provided: ADA - How to Thrive: A Guide for Your Journey with Diabetes, Meal plan card and Snack sheet  If problems or questions, patient to contact team via:  Phone  Future DSME appointment: Yearly

## 2019-06-13 ENCOUNTER — Ambulatory Visit (INDEPENDENT_AMBULATORY_CARE_PROVIDER_SITE_OTHER): Payer: BC Managed Care – PPO | Admitting: Internal Medicine

## 2019-06-13 ENCOUNTER — Encounter: Payer: Self-pay | Admitting: Internal Medicine

## 2019-06-13 VITALS — BP 118/78 | HR 95 | Temp 98.5°F | Ht 72.0 in | Wt 180.2 lb

## 2019-06-13 DIAGNOSIS — E109 Type 1 diabetes mellitus without complications: Secondary | ICD-10-CM

## 2019-06-13 LAB — MICROALBUMIN / CREATININE URINE RATIO
Creatinine,U: 74.3 mg/dL
Microalb Creat Ratio: 0.9 mg/g (ref 0.0–30.0)
Microalb, Ur: <0.7 mg/dL (ref 0.0–1.9)

## 2019-06-13 NOTE — Patient Instructions (Signed)
-   Continue Lantus 20 units once daily  - Continue Novolog 4 units with each meal    - Check sugar before each meal and bedtime     - HOW TO TREAT LOW BLOOD SUGARS (Blood sugar LESS THAN 70 MG/DL)  Please follow the RULE OF 15 for the treatment of hypoglycemia treatment (when your (blood sugars are less than 70 mg/dL)    STEP 1: Take 15 grams of carbohydrates when your blood sugar is low, which includes:   3-4 GLUCOSE TABS  OR  3-4 OZ OF JUICE OR REGULAR SODA OR  ONE TUBE OF GLUCOSE GEL     STEP 2: RECHECK blood sugar in 15 MINUTES STEP 3: If your blood sugar is still low at the 15 minute recheck --> then, go back to STEP 1 and treat AGAIN with another 15 grams of carbohydrates.

## 2019-06-13 NOTE — Progress Notes (Signed)
Name: Gary Stewart  MRN/ DOB: [id number], [birth year]   Age/ Sex: 64 y.o., male    PCP: Cari Caraway, MD   Reason for Endocrinology Evaluation: Insulin -Dependent  Diabetes Mellitus     Date of Initial Endocrinology Visit: 06/14/2019     PATIENT IDENTIFIER: Gary Stewart is a 64 y.o. male with a past medical history of sarcoma S/P resection, left foot drop  . The patient presented for initial endocrinology clinic visit on 06/14/2019 for consultative assistance with his diabetes management.    HPI: Gary Stewart was    Diagnosed with DM in 04/2019. Pt presented with symptomatic hyperglycemia with a serum glucose of 1076 mg/dL, with low Co2 at 19 mmol/L, elevated BHB 6.78 mmol/L and an elevated AG at 20. He was started on IV insulin and subsequently transitioned to MDI regimen   Currently checking blood sugars 4 x / day,  Before before meals  Hypoglycemia episodes : yes           Symptoms:   No symptoms      Frequency: 1/ months  Hemoglobin A1c > 18.5 % upon diagnosis Patient required assistance for hypoglycemia: no  Patient has required hospitalization within the last 1 year from hyper or hypoglycemia: yes 04/2019  In terms of diet, the patient eats 3 meals a day, snacks 2 times a day, snacks 2x a day. Avoids sugar- sweetened beverages.   Brother with T2DM  Pt has not seen a physician in over 3 yrs.    HOME DIABETES REGIMEN: Lantus 20 units Novolog 4 units TIDQAC   Statin: yes ACE-I/ARB: no Prior Diabetic Education: yes   METER DOWNLOAD SUMMARY: unable to data download CGM.  I have reviewed the logbook on the receiver, and BG seem to be within goal   DIABETIC COMPLICATIONS: Microvascular complications:    Denies: CKD, retinopathy, neuropathy   Last eye exam: Completed 06/2018  Macrovascular complications:    Denies: CAD, PVD, CVA   PAST HISTORY: Past Medical History:  Past Medical History:  Diagnosis Date  . Diabetes ([place])   . Hyperlipidemia     . Wears glasses    Past Surgical History:  Past Surgical History:  Procedure Laterality Date  . EPIGASTRIC HERNIA REPAIR N/A 07/10/2014   Procedure: INCARCERATED EPIGASTRIC HERNIA REPAIR WITH MESH;  Surgeon: Armandina Gemma, MD;  Location: [facility];  Service: General;  Laterality: N/A;  . 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 WISDOM TOOTH EXTRACTION        Social History:  reports that he has never smoked. He has never used smokeless tobacco. He reports current alcohol use. He reports that he does not use drugs. Family History:  Family History  Problem Relation Age of Onset  . Parkinson's disease Father   . Diabetes Brother      HOME MEDICATIONS: Allergies as of 06/13/2019      Reactions   Penicillins Rash   Did it involve swelling of the face/tongue/throat, SOB, or low BP? No Did it involve sudden or severe rash/hives, skin peeling, or any reaction on the inside of your mouth or nose? Yes Did you need to seek medical attention at a hospital or doctor's office? Yes When did it last happen?2000 If all above answers are "NO", may proceed with cephalosporin use.      Medication List       Accurate as of June 13, 2019 11:59 PM. If you have any questions, ask your nurse  or doctor.        atorvastatin 20 MG tablet Commonly known as: LIPITOR Take 20 mg by mouth daily.   blood glucose meter kit and supplies Dispense based on patient and insurance preference. Use up to four times daily as directed. (FOR ICD-10 E10.9, E11.9).   FreeStyle Libre 2 Sensor Misc USE TO TEST BLOOD SUGAR AS DIRECTED   Insulin Pen Needle 31G X 5 MM Misc BD Pen Needles- brand specific   Lantus SoloStar 100 UNIT/ML Solostar Pen Generic drug: Insulin Glargine Inject 20 Units into the skin at bedtime.   NovoLOG FlexPen 100 UNIT/ML FlexPen Generic drug: insulin aspart Inject 4 Units into the skin 3 (three) times daily with meals.        ALLERGIES: Allergies  Allergen  Reactions  . Penicillins Rash    Did it involve swelling of the face/tongue/throat, SOB, or low BP? No Did it involve sudden or severe rash/hives, skin peeling, or any reaction on the inside of your mouth or nose? Yes Did you need to seek medical attention at a hospital or doctor's office? Yes When did it last happen?2000 If all above answers are "NO", may proceed with cephalosporin use.      REVIEW OF SYSTEMS: A comprehensive ROS was conducted with the patient and is negative except as per HPI and below:  Review of Systems  Gastrointestinal: Negative for diarrhea and nausea.  Neurological: Negative for tingling and tremors.      OBJECTIVE:   VITAL SIGNS: BP 118/78 (BP Location: Left Arm, Patient Position: Sitting, Cuff Size: Large)   Pulse 95   Temp 98.5 F (36.9 C)   Ht 6' (1.829 m)   Wt 180 lb 3.2 oz (81.7 kg)   SpO2 98%   BMI 24.44 kg/m    PHYSICAL EXAM:  General: Pt appears well and is in NAD  HEENT: Eyes: External eye exam normal without stare, lid lag or exophthalmos.  EOM intact.   Neck: General: Supple without adenopathy or carotid bruits. Thyroid: Thyroid size normal.  No goiter or nodules appreciated. No thyroid bruit.  Lungs: Clear with good BS bilat with no rales, rhonchi, or wheezes  Heart: RRR with normal S1 and S2 and no gallops; no murmurs; no rub  Abdomen: Normoactive bowel sounds, soft, nontender, without masses or organomegaly palpable  Extremities:  Lower extremities - No pretibial edema. No lesions.  Skin: Normal texture and temperature to palpation. No rash noted. No Acanthosis nigricans/skin tags.   Neuro: MS is good with appropriate affect, pt is alert and Ox3    DM foot exam: 06/13/2019  The skin of the feet is intact without sores or ulcerations. The pedal pulses are 2+ on right and 2+ on left. The sensation is intact to a screening 5.07, 10 gram monofilament bilaterally   DATA REVIEWED:  Lab Results  Component Value Date    HGBA1C >18.5 (H) 04/27/2019   Lab Results  Component Value Date   MICROALBUR <0.7 06/13/2019   CREATININE 0.78 04/27/2019      ASSESSMENT / PLAN / RECOMMENDATIONS:   1) Type 1 Diabetes Mellitus, Newly diagnosed- Most recent A1c of >18.5 %. Goal A1c <7.0 %.     - I am not clear yet on what type of diabetes he has. Things that go with HHS is the his serum osmolality was 347.85 mOsm/Kg, Bicarb level as well as high Glucose level and the presentation of foot drop is more commonly seen HHS vs DKA.  - I am  going to label him as T1DM until proven otherwise  - Will proceed with GAD-65 and islet cell antibody testing at this time.  - He is doing well with the current regimen and no changes will be made at this time.  - Pt was educated on the use of glucose meter, he will need to have this as a back up plan in case there's a sensor malfunction.  -I have discussed with the patient the pathophysiology of diabetes. We went over the natural progression of the disease. We talked about both insulin resistance and insulin deficiency. We stressed the importance of lifestyle changes including diet and exercise. I explained the complications associated with diabetes including retinopathy, nephropathy, neuropathy as well as increased risk of cardiovascular disease. We went over the benefit seen with glycemic control.  - I explained to the patient that diabetic patients are at higher than normal risk for amputations.   MEDICATIONS:  Lantus 20 units daily   Novolog 4 units with each meal   EDUCATION / INSTRUCTIONS:  BG monitoring instructions: Patient is instructed to check his blood sugars 4 times a day, before meals and bedtime .  Call [facility] if: BG persistently < 70 or > 300. . I reviewed the Rule of 15 for the treatment of hypoglycemia in detail with the patient. Literature supplied.   2) Diabetic complications:   Eye: Unknown to  have known diabetic retinopathy. 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 He is not on an ACEI/ARB at present. Urine albumin/creatinine ratio normal on today's labs   3) Lipids: Patient is on Lipitor . Tolerating it well    F/u in 3 months       Signed electronically by: Mack Guise, MD  [facility]  [facility] [address] [place], [place] [address] Phone: [phone] FAX: [phone]   CC: Cari Caraway, [place] [address] Phone: [phone]  Fax: [phone]    Return to Endocrinology clinic as below: Future Appointments  Date Time Provider [place]  09/12/2019  1:20 PM Merisa Julio, Melanie Crazier, MD [id number] None

## 2019-06-14 ENCOUNTER — Encounter: Payer: Self-pay | Admitting: Internal Medicine

## 2019-06-18 ENCOUNTER — Encounter: Payer: Self-pay | Admitting: Dietician

## 2019-06-20 LAB — GLUTAMIC ACID DECARBOXYLASE AUTO ABS: Glutamic Acid Decarb Ab: <5 IU/mL (ref <5)

## 2019-06-20 LAB — ISLET CELL AB SCREEN RFLX TO TITER: ISLET CELL ANTIBODY SCREEN: NEGATIVE

## 2019-07-25 DIAGNOSIS — H25043 Posterior subcapsular polar age-related cataract, bilateral: Secondary | ICD-10-CM | POA: Diagnosis not present

## 2019-07-31 DIAGNOSIS — E7800 Pure hypercholesterolemia, unspecified: Secondary | ICD-10-CM | POA: Diagnosis not present

## 2019-07-31 DIAGNOSIS — E109 Type 1 diabetes mellitus without complications: Secondary | ICD-10-CM | POA: Diagnosis not present

## 2019-08-04 DIAGNOSIS — I700 Atherosclerosis of aorta: Secondary | ICD-10-CM | POA: Diagnosis not present

## 2019-08-04 DIAGNOSIS — M21372 Foot drop, left foot: Secondary | ICD-10-CM | POA: Diagnosis not present

## 2019-08-04 DIAGNOSIS — H2589 Other age-related cataract: Secondary | ICD-10-CM | POA: Diagnosis not present

## 2019-08-04 DIAGNOSIS — E109 Type 1 diabetes mellitus without complications: Secondary | ICD-10-CM | POA: Diagnosis not present

## 2019-09-08 ENCOUNTER — Other Ambulatory Visit: Payer: Self-pay

## 2019-09-12 ENCOUNTER — Ambulatory Visit (INDEPENDENT_AMBULATORY_CARE_PROVIDER_SITE_OTHER): Payer: BC Managed Care – PPO | Admitting: Internal Medicine

## 2019-09-12 ENCOUNTER — Encounter: Payer: Self-pay | Admitting: Internal Medicine

## 2019-09-12 ENCOUNTER — Other Ambulatory Visit: Payer: Self-pay

## 2019-09-12 VITALS — BP 152/82 | HR 86 | Temp 97.6°F | Ht 72.0 in | Wt 190.8 lb

## 2019-09-12 DIAGNOSIS — E1141 Type 2 diabetes mellitus with diabetic mononeuropathy: Secondary | ICD-10-CM | POA: Diagnosis not present

## 2019-09-12 MED ORDER — METFORMIN HCL ER 500 MG PO TB24: 500.0000 mg | ORAL_TABLET | Freq: Two times a day (BID) | ORAL | 1 refills | Status: DC

## 2019-09-12 NOTE — Progress Notes (Signed)
Name: Gary Stewart  Age/ Sex: 64 y.o., male   MRN/ DOB: [id number], 1955-12-03     PCP: Cari Caraway, MD   Reason for Endocrinology Evaluation: Type 2 Diabetes Mellitus  Initial Endocrine Consultative Visit: 06/13/2019    PATIENT IDENTIFIER: [name] is a 64 y.o. male with a past medical history of sarcoma S/P resection, left foot drop and DM. The patient has followed with Endocrinology clinic since 06/13/2019 for consultative assistance with management of his diabetes.  DIABETIC HISTORY:  Gary Stewart was diagnosed with DM in 04/2019. Pt presented with symptomatic hyperglycemia with a serum glucose of 1076 mg/dL, with low Co2 at 19 mmol/L, elevated BHB 6.78 mmol/L and an elevated AG at 20, and an A1c > 18.5%. He was started on IV insulin and subsequently transitioned to MDI regimen.  He has not been seen by a physician in 3 years.   His GAD-65 and Islet cell antibody were negative  SUBJECTIVE:   During the last visit (06/13/2019): We continued MDI regimen.   Today (09/12/2019): Gary Stewart is here for a follow up on diabetes management.  He checks his blood sugars multiple  times daily, through the CGM.  The patient has had hypoglycemic episodes since the last clinic visit, which typically occur 3 x / month- most often occuring post-prandial. The patient is symptomatic with these episodes    HOME DIABETES REGIMEN:   Lantus 20 units daily   Novolog 4 units with each meal      Statin: yes ACE-I/ARB: no    METER DOWNLOAD SUMMARY:  CONTINUOUS GLUCOSE MONITORING RECORD INTERPRETATION    Dates of Recording: 5/12-5/25/2021  Sensor description:Freestyle libre  Results statistics:   CGM use % of time 93  Average and SD 115/17.4  Time in range    99    %  % Time Above 180 1  % Time above 250 0  % Time Below target 0     Glycemic patterns summary: Optimal glycemic control  Hyperglycemic episodes  none  Hypoglycemic episodes occurred post prandial     Overnight periods: stable          DIABETIC COMPLICATIONS: Microvascular complications:    Denies: CKD, retinopathy, neuropathy   Last eye exam: Completed 06/2018  Macrovascular complications:    Denies: CAD, PVD, CVA   HISTORY:  Past Medical History:  Past Medical History:  Diagnosis Date  . Diabetes ([place])   . Hyperlipidemia   . Wears glasses    Past Surgical History:  Past Surgical History:  Procedure Laterality Date  . EPIGASTRIC HERNIA REPAIR N/A 07/10/2014   Procedure: INCARCERATED EPIGASTRIC HERNIA REPAIR WITH MESH;  Surgeon: Armandina Gemma, MD;  Location: [facility];  Service: General;  Laterality: N/A;  . 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 WISDOM TOOTH EXTRACTION      Social History:  reports that he has never smoked. He has never used smokeless tobacco. He reports current alcohol use. He reports that he does not use drugs. Family History:  Family History  Problem Relation Age of Onset  . Parkinson's disease Father   . Diabetes Brother      HOME MEDICATIONS: Allergies as of 09/12/2019      Reactions   Penicillins Rash   Did it involve swelling of the face/tongue/throat, SOB, or low BP? No Did it involve sudden or severe rash/hives, skin peeling, or any reaction on the inside of your mouth or nose? Yes Did you  need to seek medical attention at a hospital or doctor's office? Yes When did it last happen?2000 If all above answers are "NO", may proceed with cephalosporin use.      Medication List       Accurate as of Sep 12, 2019  1:33 PM. If you have any questions, ask your nurse or doctor.        atorvastatin 20 MG tablet Commonly known as: LIPITOR Take 20 mg by mouth daily.   blood glucose meter kit and supplies Dispense based on patient and insurance preference. Use up to four times daily as directed. (FOR ICD-10 E10.9, E11.9).   FreeStyle Libre 2 Sensor Misc USE TO TEST BLOOD SUGAR AS DIRECTED   Insulin Pen  Needle 31G X 5 MM Misc BD Pen Needles- brand specific   Lantus SoloStar 100 UNIT/ML Solostar Pen Generic drug: insulin glargine Inject 20 Units into the skin at bedtime.   NovoLOG FlexPen 100 UNIT/ML FlexPen Generic drug: insulin aspart Inject 4 Units into the skin 3 (three) times daily with meals.        OBJECTIVE:   Vital Signs: BP (!) 152/82 (BP Location: Left Arm, Patient Position: Sitting, Cuff Size: Normal)   Pulse 86   Temp 97.6 F (36.4 C)   Ht 6' (1.829 m)   Wt 190 lb 12.8 oz (86.5 kg)   SpO2 97%   BMI 25.88 kg/m   Wt Readings from Last 3 Encounters:  09/12/19 190 lb 12.8 oz (86.5 kg)  06/13/19 180 lb 3.2 oz (81.7 kg)  06/18/19 177 lb (80.3 kg)     Exam: General: Pt appears well and is in NAD  Lungs: Clear with good BS bilat with no rales, rhonchi, or wheezes  Heart: RRR with normal S1 and S2 and no gallops; no murmurs; no rub  Abdomen: Normoactive bowel sounds, soft, nontender, without masses or organomegaly palpable  Extremities: No pretibial edema.   Skin: Normal texture and temperature to palpation.   Neuro: MS is good with appropriate affect, pt is alert and Ox3      DM foot exam: 06/13/2019  The skin of the feet is intact without sores or ulcerations. The pedal pulses are 2+ on right and 2+ on left. The sensation is intact to a screening 5.07, 10 gram monofilament bilaterally    DATA REVIEWED:  Lab Results  Component Value Date   HGBA1C >18.5 (H) 04/27/2019   Lab Results  Component Value Date   MICROALBUR <0.7 06/13/2019   CREATININE 0.78 04/27/2019   Lab Results  Component Value Date   MICRALBCREAT 0.9 06/13/2019      Results for Gary Stewart, Gary Stewart (MRN [id number]) as of 09/12/2019 12:39  Ref. Range 06/13/2019 14:17  Glutamic Acid Decarb Ab Latest Ref Range: <5 IU/mL <5   Results for Gary Stewart, Gary Stewart (MRN [id number]) as of 09/12/2019 12:39  Ref. Range 06/13/2019 14:17  ISLET CELL ANTIBODY SCREEN Latest Ref Range: NEGATIVE  NEGATIVE     ASSESSMENT / PLAN / RECOMMENDATIONS:   1) Type 2 Diabetes Mellitus, Optimally controlled, With hx of foot drop  complications - Most recent A1c of 6.0 %. Goal A1c < 7.0 %.     - I am kinda leaning towards Type 2 DM, given that his  GAD-65 and islet cell Ab levels have come back normal ,(this does NOT exclude immune mediated diabetes 100 % , as there are other antibodies associated with T1 DM.) as well as his presentation with foot drop and  Serum  Osm on initial presentation.  - I am going to stop novolog and start Metformin as well as continuing with lantus.  - Will consider C-peptide levels in the future.    MEDICATIONS: - Stop Novolog  - Start Metformin 1 tablet daily with Breakfast for 1 week, then increase to 1 tablet with Breakfast and 1 tablet with Supper  - Continue Lantus 20 units daily    EDUCATION / INSTRUCTIONS:  BG monitoring instructions: Patient is instructed to check his blood sugars 3 times a day  Call [facility] if: BG persistently < 70 or > 180. . I reviewed the Rule of 15 for the treatment of hypoglycemia in detail with the patient. Literature supplied.    2) Diabetic complications:   Eye: Does not have known diabetic retinopathy.   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 He   Is not on an ACEI/ARB at present.      F/U in 3 months    Signed electronically by: Mack Guise, MD  [facility]  [facility] [address] [place], [place] [address] Phone: [phone] FAX: [phone]   CC: Cari Caraway, [place] [address] Phone: [phone]  Fax: [phone]  Return to Endocrinology clinic as below: No future appointments.

## 2019-09-12 NOTE — Patient Instructions (Signed)
-   Stop Novolog  - Start Metformin 1 tablet daily with Breakfast for 1 week, then increase to 1 tablet with Breakfast and 1 tablet with Supper  - Continue Lantus 20 units daily     - Contact our office if glucose is below 70 or consistently over 180 mg/dL      HOW TO TREAT LOW BLOOD SUGARS (Blood sugar LESS THAN 70 MG/DL)  Please follow the RULE OF 15 for the treatment of hypoglycemia treatment (when your (blood sugars are less than 70 mg/dL)    STEP 1: Take 15 grams of carbohydrates when your blood sugar is low, which includes:   3-4 GLUCOSE TABS  OR  3-4 OZ OF JUICE OR REGULAR SODA OR  ONE TUBE OF GLUCOSE GEL     STEP 2: RECHECK blood sugar in 15 MINUTES STEP 3: If your blood sugar is still low at the 15 minute recheck --> then, go back to STEP 1 and treat AGAIN with another 15 grams of carbohydrates.

## 2019-10-11 DIAGNOSIS — H25811 Combined forms of age-related cataract, right eye: Secondary | ICD-10-CM | POA: Diagnosis not present

## 2019-10-11 DIAGNOSIS — H2511 Age-related nuclear cataract, right eye: Secondary | ICD-10-CM | POA: Diagnosis not present

## 2019-11-01 DIAGNOSIS — H25812 Combined forms of age-related cataract, left eye: Secondary | ICD-10-CM | POA: Diagnosis not present

## 2019-11-01 DIAGNOSIS — H2512 Age-related nuclear cataract, left eye: Secondary | ICD-10-CM | POA: Diagnosis not present

## 2019-12-15 ENCOUNTER — Ambulatory Visit (INDEPENDENT_AMBULATORY_CARE_PROVIDER_SITE_OTHER): Payer: BC Managed Care – PPO | Admitting: Internal Medicine

## 2019-12-15 ENCOUNTER — Other Ambulatory Visit: Payer: Self-pay

## 2019-12-15 ENCOUNTER — Encounter: Payer: Self-pay | Admitting: Internal Medicine

## 2019-12-15 VITALS — BP 136/70 | HR 86 | Wt 191.4 lb

## 2019-12-15 DIAGNOSIS — E785 Hyperlipidemia, unspecified: Secondary | ICD-10-CM

## 2019-12-15 DIAGNOSIS — M25549 Pain in joints of unspecified hand: Secondary | ICD-10-CM | POA: Diagnosis not present

## 2019-12-15 DIAGNOSIS — K5900 Constipation, unspecified: Secondary | ICD-10-CM

## 2019-12-15 DIAGNOSIS — E1141 Type 2 diabetes mellitus with diabetic mononeuropathy: Secondary | ICD-10-CM | POA: Diagnosis not present

## 2019-12-15 LAB — POCT GLYCOSYLATED HEMOGLOBIN (HGB A1C): Hemoglobin A1C: 5.5 % (ref 4.0–5.6)

## 2019-12-15 MED ORDER — LANCETS MISC: 1.0000 | Freq: Every day | 1 refills | Status: DC

## 2019-12-15 MED ORDER — LANTUS SOLOSTAR 100 UNIT/ML ~~LOC~~ SOPN: 14.0000 [IU] | PEN_INJECTOR | Freq: Every day | SUBCUTANEOUS | 1 refills | Status: DC

## 2019-12-15 NOTE — Progress Notes (Signed)
Name: Gary Stewart  Age/ Sex: 64 y.o., male   MRN/ DOB: [id number], 1956-04-07     PCP: Cari Caraway, MD   Reason for Endocrinology Evaluation: Type 2 Diabetes Mellitus  Initial Endocrine Consultative Visit: 06/13/2019    Gary Stewart IDENTIFIER: Gary Stewart is a 64 y.o. male with a past medical history of sarcoma S/P resection, left foot drop and DM. The Gary Stewart has followed with Endocrinology clinic since 06/13/2019 for consultative assistance with management of his diabetes.  DIABETIC HISTORY:  Gary Stewart was diagnosed with DM in 04/2019. Gary Stewart presented with symptomatic hyperglycemia with a serum glucose of 1076 mg/dL, with low Co2 at 19 mmol/L, elevated BHB 6.78 mmol/L and an elevated AG at 20, and an A1c > 18.5%. Gary Stewart was started on IV insulin and subsequently transitioned to MDI regimen.  Gary Stewart has not been seen by a physician in 3 years.   His GAD-65 and Islet cell antibody were negative    Stopped prandial insulin and started Metformin 08/2019 SUBJECTIVE:   During the last visit (09/12/2019): A1c 6.0% . Stopped Prandial insulin and started Metformin, continue Basal insulin.  Today (12/15/2019): Gary Stewart is here for a follow up on diabetes management.  Gary Stewart checks his blood sugars multiple  times daily, through the CGM.  The Gary Stewart has had hypoglycemic episodes since the last clinic visit, which typically occur 1 x / month- most often occuring post-prandial. The Gary Stewart is symptomatic with these episodes   Had cataract sx 6 and 10/2019  Gary Stewart is c/o hand pains that Gary Stewart attributes to statins Gary Stewart with constipation that Gary Stewart thought may be related to metformin      HOME DIABETES REGIMEN:   Lantus 20 units daily   Metformin 500 mg 1 tabs BID      Statin: yes ACE-I/ARB: no    METER DOWNLOAD SUMMARY:  CONTINUOUS GLUCOSE MONITORING RECORD INTERPRETATION    Dates of Recording: 8/14-8/24/2021 Sensor description:Freestyle libre  Results statistics:   CGM use % of  time 91  Average and SD 124/16.4  Time in range    99 %  % Time Above 180 1  % Time above 250 0  % Time Below target 0     Glycemic patterns summary: Optimal glycemic control  Hyperglycemic episodes  none  Hypoglycemic episodes occurred post prandial   Overnight periods: stable          DIABETIC COMPLICATIONS: Microvascular complications:    Denies: CKD, retinopathy, neuropathy   Last eye exam: Completed 06/2018  Macrovascular complications:    Denies: CAD, PVD, CVA   HISTORY:  Past Medical History:  Past Medical History:  Diagnosis Date  . Diabetes ([place])   . Hyperlipidemia   . Wears glasses    Past Surgical History:  Past Surgical History:  Procedure Laterality Date  . EPIGASTRIC HERNIA REPAIR N/A 07/10/2014   Procedure: INCARCERATED EPIGASTRIC HERNIA REPAIR WITH MESH;  Surgeon: Armandina Gemma, MD;  Location: [facility];  Service: General;  Laterality: N/A;  . 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 WISDOM TOOTH EXTRACTION      Social History:  reports that Gary Stewart has never smoked. Gary Stewart has never used smokeless tobacco. Gary Stewart reports current alcohol use. Gary Stewart reports that Gary Stewart does not use drugs. Family History:  Family History  Problem Relation Age of Onset  . Parkinson's disease Father   . Diabetes Brother      HOME MEDICATIONS: Allergies as of 12/15/2019  Reactions   Penicillins Rash   Did it involve swelling of the face/tongue/throat, SOB, or low BP? No Did it involve sudden or severe rash/hives, skin peeling, or any reaction on the inside of your mouth or nose? Yes Did you need to seek medical attention at a hospital or doctor's office? Yes When did it last happen?2000 If all above answers are "NO", may proceed with cephalosporin use.      Medication List       Accurate as of December 15, 2019  2:33 PM. If you have any questions, ask your nurse or doctor.        atorvastatin 20 MG tablet Commonly known as: LIPITOR Take 20  mg by mouth daily.   blood glucose meter kit and supplies Dispense based on Gary Stewart and insurance preference. Use up to four times daily as directed. (FOR ICD-10 E10.9, E11.9).   FreeStyle Libre 2 Sensor Misc USE TO TEST BLOOD SUGAR AS DIRECTED   Insulin Pen Needle 31G X 5 MM Misc BD Pen Needles- brand specific   Lantus SoloStar 100 UNIT/ML Solostar Pen Generic drug: insulin glargine Inject 20 Units into the skin at bedtime.   metFORMIN 500 MG 24 hr tablet Commonly known as: GLUCOPHAGE-XR Take 1 tablet (500 mg total) by mouth 2 (two) times daily with a meal.        OBJECTIVE:   Vital Signs: BP 136/70 (BP Location: Right Arm, Gary Stewart Position: Sitting, Cuff Size: Large)   Pulse 86   Wt 191 lb 6.4 oz (86.8 kg)   SpO2 96%   BMI 25.96 kg/m   Wt Readings from Last 3 Encounters:  12/15/19 191 lb 6.4 oz (86.8 kg)  09/12/19 190 lb 12.8 oz (86.5 kg)  06/13/19 180 lb 3.2 oz (81.7 kg)     Exam: General: Gary Stewart appears well and is in NAD  Lungs: Clear with good BS bilat with no rales, rhonchi, or wheezes  Heart: RRR with normal S1 and S2 and no gallops; no murmurs; no rub  Abdomen: Normoactive bowel sounds, soft, nontender, without masses or organomegaly palpable  Extremities: No pretibial edema.   Neuro: MS is good with appropriate affect, Gary Stewart is alert and Ox3      DM foot exam: 06/13/2019  The skin of the feet is intact without sores or ulcerations. The pedal pulses are 2+ on right and 2+ on left. The sensation is intact to a screening 5.07, 10 gram monofilament bilaterally    DATA REVIEWED:  Lab Results  Component Value Date   HGBA1C 5.5 12/15/2019   HGBA1C >18.5 (H) 04/27/2019   Lab Results  Component Value Date   MICROALBUR <0.7 06/13/2019   CREATININE 0.78 04/27/2019   Lab Results  Component Value Date   MICRALBCREAT 0.9 06/13/2019      Results for Gary Stewart (MRN [id number]) as of 09/12/2019 12:39  Ref. Range 06/13/2019 14:17  Glutamic Acid Decarb  Ab Latest Ref Range: <5 IU/mL <5   Results for Gary Stewart (MRN [id number]) as of 09/12/2019 12:39  Ref. Range 06/13/2019 14:17  ISLET CELL ANTIBODY SCREEN Latest Ref Range: NEGATIVE  NEGATIVE   ASSESSMENT / PLAN / RECOMMENDATIONS:   1) Type 2 Diabetes Mellitus, Optimally controlled, Without complications - Most recent A1c of 5.5 %. Goal A1c < 7.0 %.     - With optimization of his A1c, will reduce his Lantus as below  - Our long term plan is to take him off insulin, I will consider a C-peptide  levels check prior to doing that .  - GAD-65 and Islet Ab levels are negative    MEDICATIONS: - Continue  Metformin 1 tablet BID - Decrease Lantus to 14 units daily    EDUCATION / INSTRUCTIONS:  BG monitoring instructions: Gary Stewart is instructed to check his blood sugars 3 times a day  Call [facility] if: BG persistently < 70 or > 180. . I reviewed the Rule of 15 for the treatment of hypoglycemia in detail with the Gary Stewart. Literature supplied.    2) Diabetic complications:   Eye: Does not have known diabetic retinopathy.   Neuro/ Feet: Does have mononeuropathy  (left foot drop)- this has been improving   Renal: Gary Stewart does not have known baseline CKD. Gary Stewart   Is not on an ACEI/ARB at present.    3) Dyslipdemia :  - LDL at 70 mg/dL , down from 140 mg/dL . We discussed the cardiovascular benefits of statins . I have advised him to start taking OTC Vitamin D3 1000 iu daily and will see how Gary Stewart does with that. We can always consider reducing the dose if necessary  4) Constipation:   - I explained to the Gary Stewart that Metformin causes diarrhea rather than constipation. Gary Stewart may use colace or miralax as needed       F/U in 3 months    Signed electronically by: Mack Guise, MD  [facility] End[facility]  [facility] [address] [place], [place] [address] Phone: [phone] FAX: [phone]   CC: Cari Caraway, [place] [address] Phone: [phone]  Fax: [phone]  Return to Endocrinology clinic as below: No future appointments.

## 2019-12-15 NOTE — Patient Instructions (Signed)
-   Continue  Metformin 1 tablet daily with Breakfast and 1 tablet with Supper  - Decrease  Lantus to 14 units daily      HOW TO TREAT LOW BLOOD SUGARS (Blood sugar LESS THAN 70 MG/DL)  Please follow the RULE OF 15 for the treatment of hypoglycemia treatment (when your (blood sugars are less than 70 mg/dL)    STEP 1: Take 15 grams of carbohydrates when your blood sugar is low, which includes:   3-4 GLUCOSE TABS  OR  3-4 OZ OF JUICE OR REGULAR SODA OR  ONE TUBE OF GLUCOSE GEL     STEP 2: RECHECK blood sugar in 15 MINUTES STEP 3: If your blood sugar is still low at the 15 minute recheck --> then, go back to STEP 1 and treat AGAIN with another 15 grams of carbohydrates.

## 2020-03-03 ENCOUNTER — Other Ambulatory Visit: Payer: Self-pay | Admitting: Internal Medicine

## 2020-03-18 ENCOUNTER — Other Ambulatory Visit: Payer: Self-pay | Admitting: Internal Medicine

## 2020-05-17 ENCOUNTER — Telehealth: Payer: Self-pay

## 2020-05-17 NOTE — Telephone Encounter (Signed)
EKG ON FILE °

## 2020-05-26 NOTE — Progress Notes (Signed)
Cardiology Office Note:    Date:  05/29/2020   ID:  Gary Stewart, DOB 02/16/1956, MRN [id number]  PCP:  Gary Caraway, MD  [facility] Cardiologist:  No primary care provider on file.  [facility] Electrophysiologist:  None   Referring MD: Gary Caraway, MD    History of Present Illness:    Gary Stewart is a 65 y.o. male with a hx of DMII and HLD who was referred to clinic by Dr. Addison Lank for further evaluation of abnormal ECG.  Patient was seen in PCPs office where he was doing well. He is very active and walks 2 miles a day without issues. Does weight training 3x/week. His cholesterol is well controlled with TC 144, HDL 52, LDL 67. ECG was obtained which showed NSR, occasional PVCs, TWI in anterior leads (no prior) prompting referral here.   Patient states that he feels overall well with no exertional symptoms. No decreased exercise capacity. Denies any chest pain, SOB, LE edema, lightheadedness or dizziness. He had the ECG as part of a routine check-up. No priors in our system. Has underlying DMII and was started on insulin. Glucose has been well controlled.   Family history negative for heart disease.  Past Medical History:  Diagnosis Date  . Diabetes ([place])   . Hyperlipidemia   . Wears glasses     Past Surgical History:  Procedure Laterality Date  . EPIGASTRIC HERNIA REPAIR N/A 07/10/2014   Procedure: INCARCERATED EPIGASTRIC HERNIA REPAIR WITH MESH;  Surgeon: Gary Gemma, MD;  Location: [facility];  Service: General;  Laterality: N/A;  . 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 WISDOM TOOTH EXTRACTION      Current Medications: Current Meds  Medication Sig  . atorvastatin (LIPITOR) 20 MG tablet Take 20 mg by mouth daily.  . blood glucose meter kit and supplies Dispense based on patient and insurance preference. Use up to four times daily as directed. (FOR ICD-10 E10.9, E11.9).  . cholecalciferol (VITAMIN D3) 25 MCG (1000 UNIT) tablet Take 1,000  Units by mouth daily.  . Continuous Blood Gluc Sensor (FREESTYLE LIBRE 2 SENSOR) MISC USE TO TEST BLOOD SUGAR AS DIRECTED  . insulin glargine (LANTUS SOLOSTAR) 100 UNIT/ML Solostar Pen Inject 14 Units into the skin at bedtime.  . Insulin Pen Needle 31G X 5 MM MISC BD Pen Needles- brand specific  . Lancets (ONETOUCH DELICA PLUS [id number]) MISC USE TO CHECK BLOOD SUGAR EVERY DAY  . metFORMIN (GLUCOPHAGE-XR) 500 MG 24 hr tablet TAKE 1 TABLET(500 MG) BY MOUTH TWICE DAILY WITH A MEAL  . sildenafil (REVATIO) 20 MG tablet Take 20 mg by mouth daily as needed.     Allergies:   Penicillins   Social History   Socioeconomic History  . Marital status: Married    Spouse name: Not on file  . Number of children: Not on file  . Years of education: Not on file  . Highest education level: Not on file  Occupational History  . Not on file  Tobacco Use  . Smoking status: Never Smoker  . Smokeless tobacco: Never Used  Vaping Use  . Vaping Use: Never used  Substance and Sexual Activity  . Alcohol use: Yes    Comment: occ  . Drug use: No  . Sexual activity: Not on file  Other Topics Concern  . Not on file  Social History Narrative   Right handed   Two story home   Drinks caffeine   Social Determinants  of Health   Financial Resource Strain: Not on file  Food Insecurity: Not on file  Transportation Needs: Not on file  Physical Activity: Not on file  Stress: Not on file  Social Connections: Not on file     Family History: The patient's family history includes Diabetes in his brother; Parkinson's disease in his father.  ROS:   Please see the history of present illness.    Review of Systems  Constitutional: Negative for chills and fever.  HENT: Negative for nosebleeds.   Eyes: Negative for blurred vision and redness.  Respiratory: Negative for shortness of breath.   Cardiovascular: Negative for chest pain, palpitations, orthopnea, claudication, leg swelling and PND.  Gastrointestinal:  Negative for nausea and vomiting.  Genitourinary: Negative for hematuria.  Musculoskeletal: Negative for falls.  Neurological: Negative for dizziness and loss of consciousness.  Endo/Heme/Allergies: Negative for polydipsia.  Psychiatric/Behavioral: Negative for substance abuse.    EKGs/Labs/Other Studies Reviewed:    The following studies were reviewed today: CT abdomen/pelvis 04/2019: FINDINGS: Lower chest: Small air cyst in the right lung base. Minimal atelectatic change. Normal heart size. No pericardial effusion.  Hepatobiliary: Subcentimeter hypoattenuating focus along the posterior left lobe liver (3/21) is too small to fully characterize on CT imaging but statistically likely benign. No worrisome hepatic lesions, no gallstones, gallbladder wall thickening, or biliary dilatation.  Pancreas: Unremarkable. No pancreatic ductal dilatation or surrounding inflammatory changes.  Spleen: Normal in size without focal abnormality.  Adrenals/Urinary Tract: Adrenal glands are unremarkable. Kidneys are normal, without renal calculi, focal lesion, or hydronephrosis. There is focal anterior bladder wall thickening.  Stomach/Bowel: Distal esophagus, stomach and duodenal sweep are unremarkable. No small bowel wall thickening or dilatation. No evidence of obstruction. A normal appendix is visualized. No colonic dilatation or wall thickening. Scattered colonic diverticula without focal pericolonic inflammation to suggest diverticulitis.  Vascular/Lymphatic: Atherosclerotic plaque within the normal caliber aorta. No suspicious or enlarged lymph nodes in the included lymphatic chains.  Reproductive: Coarse likely benign calcification of the prostate. No concerning abnormalities of the prostate or seminal vesicles.  Other: Evidence of prior ventral hernia repair (3/54). No bowel containing hernias. No free air or fluid in the abdomen or pelvis.  Musculoskeletal: Multilevel  degenerative changes are present in the imaged portions of the spine. No acute osseous abnormality or suspicious osseous lesion.  IMPRESSION: 1. Focal anterior bladder wall thickening. Correlate with urinalysis to exclude cystitis and consider further evaluation with cystoscopy. 2. Colonic diverticulosis without evidence of acute diverticulitis. 3. Evidence of prior ventral hernia repair. Small fat containing bilateral inguinal hernias. 4.  Aortic Atherosclerosis (ICD10-I70.0).  EKG:  EKG on 05/15/20 with NSR with TWI in V3-V6, isolated PVC. No prior.  Recent Labs: No results found for requested labs within last 8760 hours.  Recent Lipid Panel No results found for: CHOL, TRIG, HDL, CHOLHDL, VLDL, LDLCALC, LDLDIRECT   Physical Exam:    VS:  BP 120/84   Pulse 92   Ht 6' (1.829 m)   Wt 196 lb (88.9 kg)   SpO2 97%   BMI 26.58 kg/m     Wt Readings from Last 3 Encounters:  05/29/20 196 lb (88.9 kg)  12/15/19 191 lb 6.4 oz (86.8 kg)  09/12/19 190 lb 12.8 oz (86.5 kg)     GEN:  Well nourished, well developed in no acute distress HEENT: Normal NECK: No JVD; No carotid bruits CARDIAC: RRR, no murmurs, rubs, gallops RESPIRATORY:  Clear to auscultation without rales, wheezing or rhonchi  ABDOMEN: Soft,  non-tender, non-distended MUSCULOSKELETAL:  No edema; No deformity  SKIN: Warm and dry NEUROLOGIC:  Alert and oriented x 3 PSYCHIATRIC:  Normal affect   ASSESSMENT:    1. Abnormal electrocardiogram   2. Nonspecific abnormal electrocardiogram (ECG) (EKG)   3. Type 2 diabetes mellitus without complication, with long-term current use of insulin ([place])   4. Mixed hyperlipidemia    PLAN:    In order of problems listed above:  #Abnormal ECG: Patient with TWI in anterior leads on ECG that was detected at PCP visit for routine surveillance care. He is asymptomatic and active with no exertional symptoms. No personal or family history of CAD. No prior ECG in our system. Patient  notably has underlying DMII so while he is asymptomatic, there is concern for silent ischemia. After discussion with the patient, he would like to pursue stress testing at this time. -Will obtain lexiscan to assess for ischemia  #HLD: -Continue atorvastatin 20mg  daily  #DMII: Followed by Endocrinology. -Continue insulin   Shared Decision Making/Informed Consent The risks [chest pain, shortness of breath, cardiac arrhythmias, dizziness, blood pressure fluctuations, myocardial infarction, stroke/transient ischemic attack, nausea, vomiting, allergic reaction, radiation exposure, metallic taste sensation and life-threatening complications (estimated to be 1 in 10,000)], benefits (risk stratification, diagnosing coronary artery disease, treatment guidance) and alternatives of a nuclear stress test were discussed in detail with Mr. Carlile and he agrees to proceed.   Medication Adjustments/Labs and Tests Ordered: Current medicines are reviewed at length with the patient today.  Concerns regarding medicines are outlined above.  Orders Placed This Encounter  Procedures  . Myocardial Perfusion Imaging   No orders of the defined types were placed in this encounter.   Patient Instructions  Medication Instructions:  Your physician recommends that you continue on your current medications as directed. Please refer to the Current Medication list given to you today.  *If you need a refill on your cardiac medications before your next appointment, please call your pharmacy*   Lab Work: none If you have labs (blood work) drawn today and your tests are completely normal, you will receive your results only by: Marland Kitchen MyChart Message (if you have MyChart) OR . A paper copy in the mail If you have any lab test that is abnormal or we need to change your treatment, we will call you to review the results.   Testing/Procedures: Your physician has requested that you have a lexiscan myoview. For further  information please visit [email or web address]. Please follow instruction sheet, as given.   Follow-Up: At [facility], you and your health needs are our priority.  As part of our continuing mission to provide you with exceptional heart care, we have created designated Provider Care Teams.  These Care Teams include your primary Cardiologist (physician) and Advanced Practice Providers (APPs -  Physician Assistants and Nurse Practitioners) who all work together to provide you with the care you need, when you need it.  We recommend signing up for the patient portal called "MyChart".  Sign up information is provided on this After Visit Summary.  MyChart is used to connect with patients for Virtual Visits (Telemedicine).  Patients are able to view lab/test results, encounter notes, upcoming appointments, etc.  Non-urgent messages can be sent to your provider as well.   To learn more about what you can do with MyChart, go to [email or web address].    Your next appointment:    AS needed  The format for your next appointment:   In Person  Provider:  Gwyndolyn Kaufman, MD      Signed, Freada Bergeron, MD  05/29/2020 3:59 PM    [facility]

## 2020-05-29 ENCOUNTER — Other Ambulatory Visit: Payer: Self-pay

## 2020-05-29 ENCOUNTER — Encounter: Payer: Self-pay | Admitting: Cardiology

## 2020-05-29 ENCOUNTER — Ambulatory Visit (INDEPENDENT_AMBULATORY_CARE_PROVIDER_SITE_OTHER): Payer: Medicare Other | Admitting: Cardiology

## 2020-05-29 VITALS — BP 120/84 | HR 92 | Ht 72.0 in | Wt 196.0 lb

## 2020-05-29 DIAGNOSIS — Z794 Long term (current) use of insulin: Secondary | ICD-10-CM | POA: Diagnosis not present

## 2020-05-29 DIAGNOSIS — R9431 Abnormal electrocardiogram [ECG] [EKG]: Secondary | ICD-10-CM

## 2020-05-29 DIAGNOSIS — E119 Type 2 diabetes mellitus without complications: Secondary | ICD-10-CM

## 2020-05-29 DIAGNOSIS — E782 Mixed hyperlipidemia: Secondary | ICD-10-CM | POA: Diagnosis not present

## 2020-05-29 NOTE — Patient Instructions (Signed)
Medication Instructions:  Your physician recommends that you continue on your current medications as directed. Please refer to the Current Medication list given to you today.  *If you need a refill on your cardiac medications before your next appointment, please call your pharmacy*   Lab Work: none If you have labs (blood work) drawn today and your tests are completely normal, you will receive your results only by: Marland Kitchen MyChart Message (if you have MyChart) OR . A paper copy in the mail If you have any lab test that is abnormal or we need to change your treatment, we will call you to review the results.   Testing/Procedures: Your physician has requested that you have a lexiscan myoview. For further information please visit [email or web address] Please follow instruction sheet, as given.   Follow-Up: At [facility], you and your health needs are our priority.  As part of our continuing mission to provide you with exceptional heart care, we have created designated Provider Care Teams.  These Care Teams include your primary Cardiologist (physician) and Advanced Practice Providers (APPs -  Physician Assistants and Nurse Practitioners) who all work together to provide you with the care you need, when you need it.  We recommend signing up for the patient portal called "MyChart".  Sign up information is provided on this After Visit Summary.  MyChart is used to connect with patients for Virtual Visits (Telemedicine).  Patients are able to view lab/test results, encounter notes, upcoming appointments, etc.  Non-urgent messages can be sent to your provider as well.   To learn more about what you can do with MyChart, go to [email or web address].    Your next appointment:    AS needed  The format for your next appointment:   In Person  Provider:   Laurance Flatten, MD

## 2020-06-04 ENCOUNTER — Encounter (HOSPITAL_COMMUNITY): Payer: Self-pay | Admitting: *Deleted

## 2020-06-04 ENCOUNTER — Telehealth (HOSPITAL_COMMUNITY): Payer: Self-pay | Admitting: *Deleted

## 2020-06-04 NOTE — Telephone Encounter (Signed)
Patient given detailed instructions per Myocardial Perfusion Study Information Sheet for the test on 06/07/20 at 0745. Patient notified to arrive 15 minutes early and that it is imperative to arrive on time for appointment to keep from having the test rescheduled.  If you need to cancel or reschedule your appointment, please call the office within 24 hours of your appointment. . Patient verbalized understanding.Aleksandra Raben, Adelene Idler Mychart letter sent

## 2020-06-07 ENCOUNTER — Other Ambulatory Visit: Payer: Self-pay

## 2020-06-07 ENCOUNTER — Ambulatory Visit (HOSPITAL_COMMUNITY): Payer: Medicare Other | Attending: Cardiovascular Disease

## 2020-06-07 DIAGNOSIS — R9431 Abnormal electrocardiogram [ECG] [EKG]: Secondary | ICD-10-CM

## 2020-06-07 LAB — MYOCARDIAL PERFUSION IMAGING
LV dias vol: 63 mL (ref 62–150)
LV sys vol: 19 mL
Peak HR: 104 {beats}/min
Rest HR: 74 {beats}/min
SDS: 0
SRS: 0
SSS: 0
TID: 1.06

## 2020-06-07 MED ORDER — REGADENOSON 0.4 MG/5ML IV SOLN
0.4000 mg | Freq: Once | INTRAVENOUS | Status: AC
  Administered 2020-06-07: 0.4 mg via INTRAVENOUS

## 2020-06-07 MED ORDER — TECHNETIUM TC 99M TETROFOSMIN IV KIT
9.4000 | PACK | Freq: Once | INTRAVENOUS | Status: AC | PRN
  Administered 2020-06-07: 9.4 via INTRAVENOUS
  Filled 2020-06-07: qty 10

## 2020-06-07 MED ORDER — TECHNETIUM TC 99M TETROFOSMIN IV KIT
31.5000 | PACK | Freq: Once | INTRAVENOUS | Status: AC | PRN
  Administered 2020-06-07: 31.5 via INTRAVENOUS
  Filled 2020-06-07: qty 32

## 2020-06-21 ENCOUNTER — Other Ambulatory Visit: Payer: Self-pay

## 2020-06-21 ENCOUNTER — Encounter: Payer: Self-pay | Admitting: Internal Medicine

## 2020-06-21 ENCOUNTER — Ambulatory Visit (INDEPENDENT_AMBULATORY_CARE_PROVIDER_SITE_OTHER): Payer: Medicare Other | Admitting: Internal Medicine

## 2020-06-21 VITALS — BP 136/78 | HR 78 | Ht 72.0 in | Wt 192.5 lb

## 2020-06-21 DIAGNOSIS — E119 Type 2 diabetes mellitus without complications: Secondary | ICD-10-CM

## 2020-06-21 LAB — POCT GLYCOSYLATED HEMOGLOBIN (HGB A1C): Hemoglobin A1C: 5.7 % — AB (ref 4.0–5.6)

## 2020-06-21 NOTE — Progress Notes (Signed)
Name: Gary Stewart  Age/ Sex: 65 y.o., male   MRN/ DOB: [id number], Jan 16, 1956     PCP: Cari Caraway, MD   Reason for Endocrinology Evaluation: Type 2 Diabetes Mellitus  Initial Endocrine Consultative Visit: 06/13/2019    PATIENT IDENTIFIER: [name] is a 65 y.o. male with a past medical history of sarcoma S/P resection, left foot drop and DM. The patient has followed with Endocrinology clinic since 06/13/2019 for consultative assistance with management of his diabetes.  DIABETIC HISTORY:  [name] was diagnosed with DM in 04/2019. Pt presented with symptomatic hyperglycemia with a serum glucose of 1076 mg/dL, with low Co2 at 19 mmol/L, elevated BHB 6.78 mmol/L and an elevated AG at 20, and an A1c > 18.5%. He was started on IV insulin and subsequently transitioned to MDI regimen.  He has not been seen by a physician in 3 years.   His GAD-65 and Islet cell antibody were negative    Stopped prandial insulin and started Metformin 08/2019 SUBJECTIVE:   During the last visit (12/15/2019): A1c 5.5% .We continued Metformin, and decreased Basal insulin.     Today (06/21/2020): [name] is here for a follow up on diabetes management.  He checks his blood sugars multiple  times daily, through the CGM.  The patient has had hypoglycemic episodes since the last clinic visit, which typically occur 3 x / week- most often occuring post-prandial. The patient is symptomatic with these episodes  Denies nausea  or diarrhea   Has occasional constipation    HOME DIABETES REGIMEN:   Lantus 14 units daily   Metformin 500 mg 1 tabs BID      Statin: yes ACE-I/ARB: no    METER DOWNLOAD SUMMARY:  CONTINUOUS GLUCOSE MONITORING RECORD INTERPRETATION    Dates of Recording: 2/19-06/21/2020 Sensor description:Freestyle libre  Results statistics:   CGM use % of time 95  Average and SD 115/18.7  Time in range    99 %  % Time Above 180 1  % Time above 250 0  % Time  Below target 0     Glycemic patterns summary: Optimal glycemic control  Hyperglycemic episodes  none  Hypoglycemic episodes occurred post prandial   Overnight periods: stable          DIABETIC COMPLICATIONS: Microvascular complications:    Denies: CKD, retinopathy, neuropathy   Last eye exam: Completed 06/2018  Macrovascular complications:    Denies: CAD, PVD, CVA   HISTORY:  Past Medical History:  Past Medical History:  Diagnosis Date  . Diabetes ([place])   . Hyperlipidemia   . Wears glasses    Past Surgical History:  Past Surgical History:  Procedure Laterality Date  . EPIGASTRIC HERNIA REPAIR N/A 07/10/2014   Procedure: INCARCERATED EPIGASTRIC HERNIA REPAIR WITH MESH;  Surgeon: Armandina Gemma, MD;  Location: [facility];  Service: General;  Laterality: N/A;  . 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 WISDOM TOOTH EXTRACTION      Social History:  reports that he has never smoked. He has never used smokeless tobacco. He reports current alcohol use. He reports that he does not use drugs. Family History:  Family History  Problem Relation Age of Onset  . Parkinson's disease Father   . Diabetes Brother      HOME MEDICATIONS: Allergies as of 06/21/2020      Reactions   Penicillins Rash   Did it involve swelling of the face/tongue/throat, SOB, or low BP? No Did  it involve sudden or severe rash/hives, skin peeling, or any reaction on the inside of your mouth or nose? Yes Did you need to seek medical attention at a hospital or doctor's office? Yes When did it last happen?2000 If all above answers are "NO", may proceed with cephalosporin use.      Medication List       Accurate as of June 21, 2020  2:52 PM. If you have any questions, ask your nurse or doctor.        atorvastatin 20 MG tablet Commonly known as: LIPITOR Take 20 mg by mouth daily.   blood glucose meter kit and supplies Dispense based on patient and insurance preference.  Use up to four times daily as directed. (FOR ICD-10 E10.9, E11.9).   cholecalciferol 25 MCG (1000 UNIT) tablet Commonly known as: VITAMIN D3 Take 1,000 Units by mouth daily.   FreeStyle Libre 2 Sensor Misc USE TO TEST BLOOD SUGAR AS DIRECTED   Insulin Pen Needle 31G X 5 MM Misc BD Pen Needles- brand specific   Lantus SoloStar 100 UNIT/ML Solostar Pen Generic drug: insulin glargine Inject 14 Units into the skin at bedtime.   metFORMIN 500 MG 24 hr tablet Commonly known as: GLUCOPHAGE-XR TAKE 1 TABLET(500 MG) BY MOUTH TWICE DAILY WITH A MEAL   OneTouch Delica Plus [id number] Misc USE TO CHECK BLOOD SUGAR EVERY DAY   sildenafil 20 MG tablet Commonly known as: REVATIO Take 20 mg by mouth daily as needed.        OBJECTIVE:   Vital Signs: BP 136/78   Pulse 78   Ht 6' (1.829 m)   Wt 192 lb 8 oz (87.3 kg)   SpO2 98%   BMI 26.11 kg/m   Wt Readings from Last 3 Encounters:  06/21/20 192 lb 8 oz (87.3 kg)  05/29/20 196 lb (88.9 kg)  12/15/19 191 lb 6.4 oz (86.8 kg)     Exam: General: Pt appears well and is in NAD  Lungs: Clear with good BS bilat with no rales, rhonchi, or wheezes  Heart: RRR with normal S1 and S2 and no gallops; no murmurs; no rub  Abdomen: Normoactive bowel sounds, soft, nontender, without masses or organomegaly palpable  Extremities: No pretibial edema.   Neuro: MS is good with appropriate affect, pt is alert and Ox3      DM foot exam: 06/21/2020 The skin of the feet is intact without sores or ulcerations. The pedal pulses are 2+ on right and 2+ on left. The sensation is intact to a screening 5.07, 10 gram monofilament bilaterally    DATA REVIEWED:  Lab Results  Component Value Date   HGBA1C 5.7 (A) 06/21/2020   HGBA1C 5.5 12/15/2019   HGBA1C >18.5 (H) 04/27/2019   Lab Results  Component Value Date   MICROALBUR <0.7 06/13/2019   CREATININE 0.78 04/27/2019   Lab Results  Component Value Date   MICRALBCREAT 0.9 06/13/2019       Results for JAYDENCE, Stewart (MRN [id number]) as of 09/12/2019 12:39  Ref. Range 06/13/2019 14:17  Glutamic Acid Decarb Ab Latest Ref Range: <5 IU/mL <5   Results for Gary Stewart (MRN [id number]) as of 09/12/2019 12:39  Ref. Range 06/13/2019 14:17  ISLET CELL ANTIBODY SCREEN Latest Ref Range: NEGATIVE  NEGATIVE   ASSESSMENT / PLAN / RECOMMENDATIONS:   1) Type 2 Diabetes Mellitus, Optimally controlled, Without complications - Most recent A1c of 5.7 %. Goal A1c < 7.0 %.    - Pt with recurrent  hypoglycemia, pt states he just ordered a new insulin prescription but I have advised he will need to stop it and keep the insulin the fridge - Our long term plan is to take him off insulin - GAD-65 and Islet Ab levels are negative    MEDICATIONS: - Continue  Metformin 1 tablet BID - STOP Lantus   EDUCATION / INSTRUCTIONS:  BG monitoring instructions: Patient is instructed to check his blood sugars 3 times a day  Call [facility] if: BG persistently < 70 . I reviewed the Rule of 15 for the treatment of hypoglycemia in detail with the patient. Literature supplied.    2) Diabetic complications:   Eye: Does not have known diabetic retinopathy.   Neuro/ Feet: He did have  mononeuropathy  (left foot drop)- but this resolved by 2021   Renal: Patient does not have known baseline CKD. He  Does NOT need to be on an ACEI/ARB at present, as his renal function, MA/Cr ratio and BP are all normal    3) Dyslipdemia :  - LDL at 67 mg/dL , down from 140 mg/dL . We discussed the cardiovascular benefits of statins . I have advised him to start taking OTC Vitamin D3 1000 iu daily and will see how he does with that. We can always consider reducing the dose if necessary        F/U in 3 months    Signed electronically by: Mack Guise, MD  [facility]  [facility] [address] [place], [place] [address] Phone:  [phone] FAX: [phone]   CC: Cari Caraway, [place] [address] Phone: [phone]  Fax: [phone]  Return to Endocrinology clinic as below: Future Appointments  Date Time Provider Elaine  09/20/2020 10:10 AM Pho[place]ia Pirie, Melanie Crazier, MD [facility] None

## 2020-06-21 NOTE — Patient Instructions (Signed)
-   STOP Lantus  - Metformin 1 tablet daily with Breakfast and 2 tablets with Supper       HOW TO TREAT LOW BLOOD SUGARS (Blood sugar LESS THAN 70 MG/DL)  Please follow the RULE OF 15 for the treatment of hypoglycemia treatment (when your (blood sugars are less than 70 mg/dL)    STEP 1: Take 15 grams of carbohydrates when your blood sugar is low, which includes:   3-4 GLUCOSE TABS  OR  3-4 OZ OF JUICE OR REGULAR SODA OR  ONE TUBE OF GLUCOSE GEL     STEP 2: RECHECK blood sugar in 15 MINUTES STEP 3: If your blood sugar is still low at the 15 minute recheck --> then, go back to STEP 1 and treat AGAIN with another 15 grams of carbohydrates.

## 2020-08-05 ENCOUNTER — Other Ambulatory Visit: Payer: Self-pay | Admitting: Internal Medicine

## 2020-08-06 IMAGING — DX DG CHEST 2V
2 series · 2 of 2 positions shown · non-contrast
Comparison: None.

CLINICAL DATA: Hyperglycemia at PCP office

EXAM:
CHEST - 2 VIEW

[chest pa]
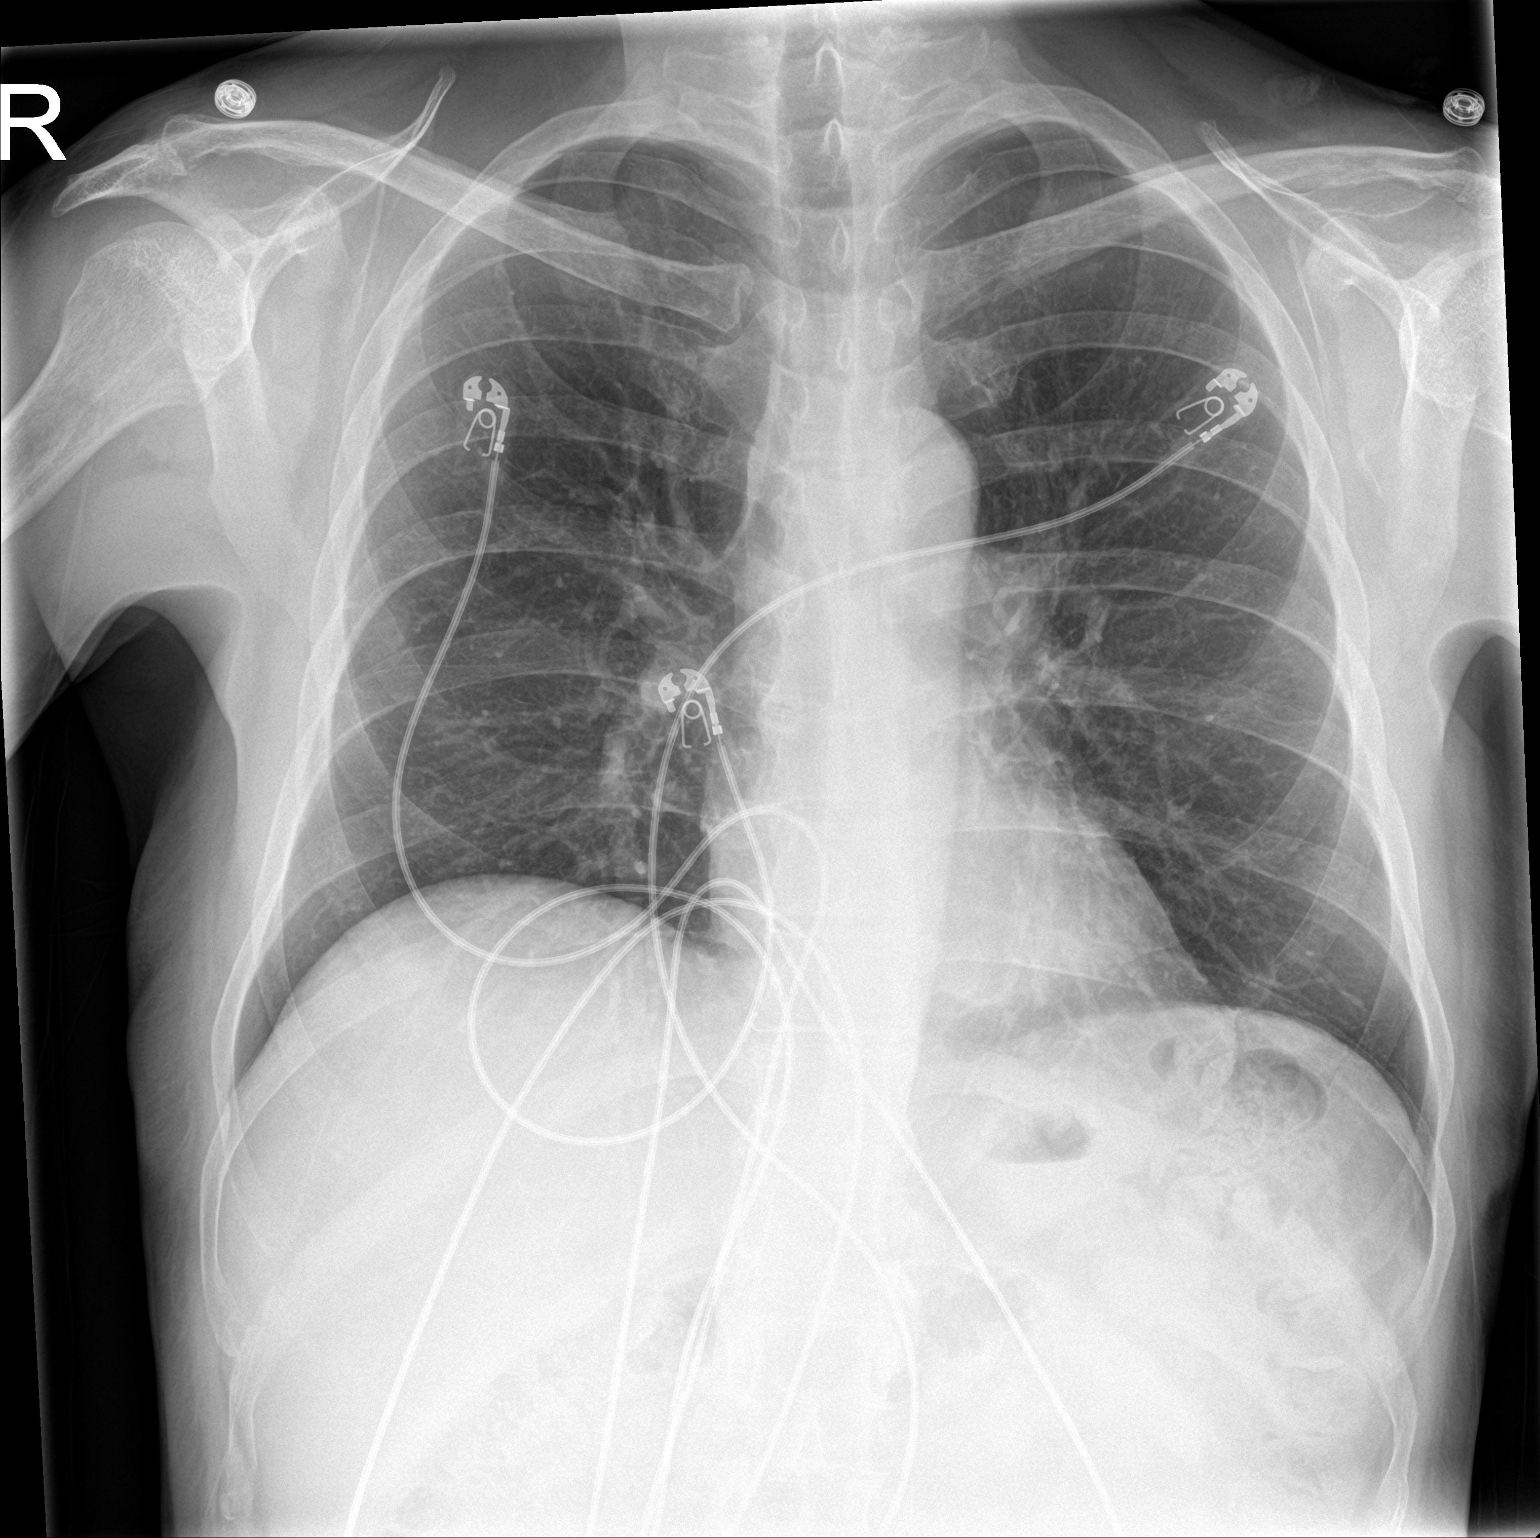

[chest lat]
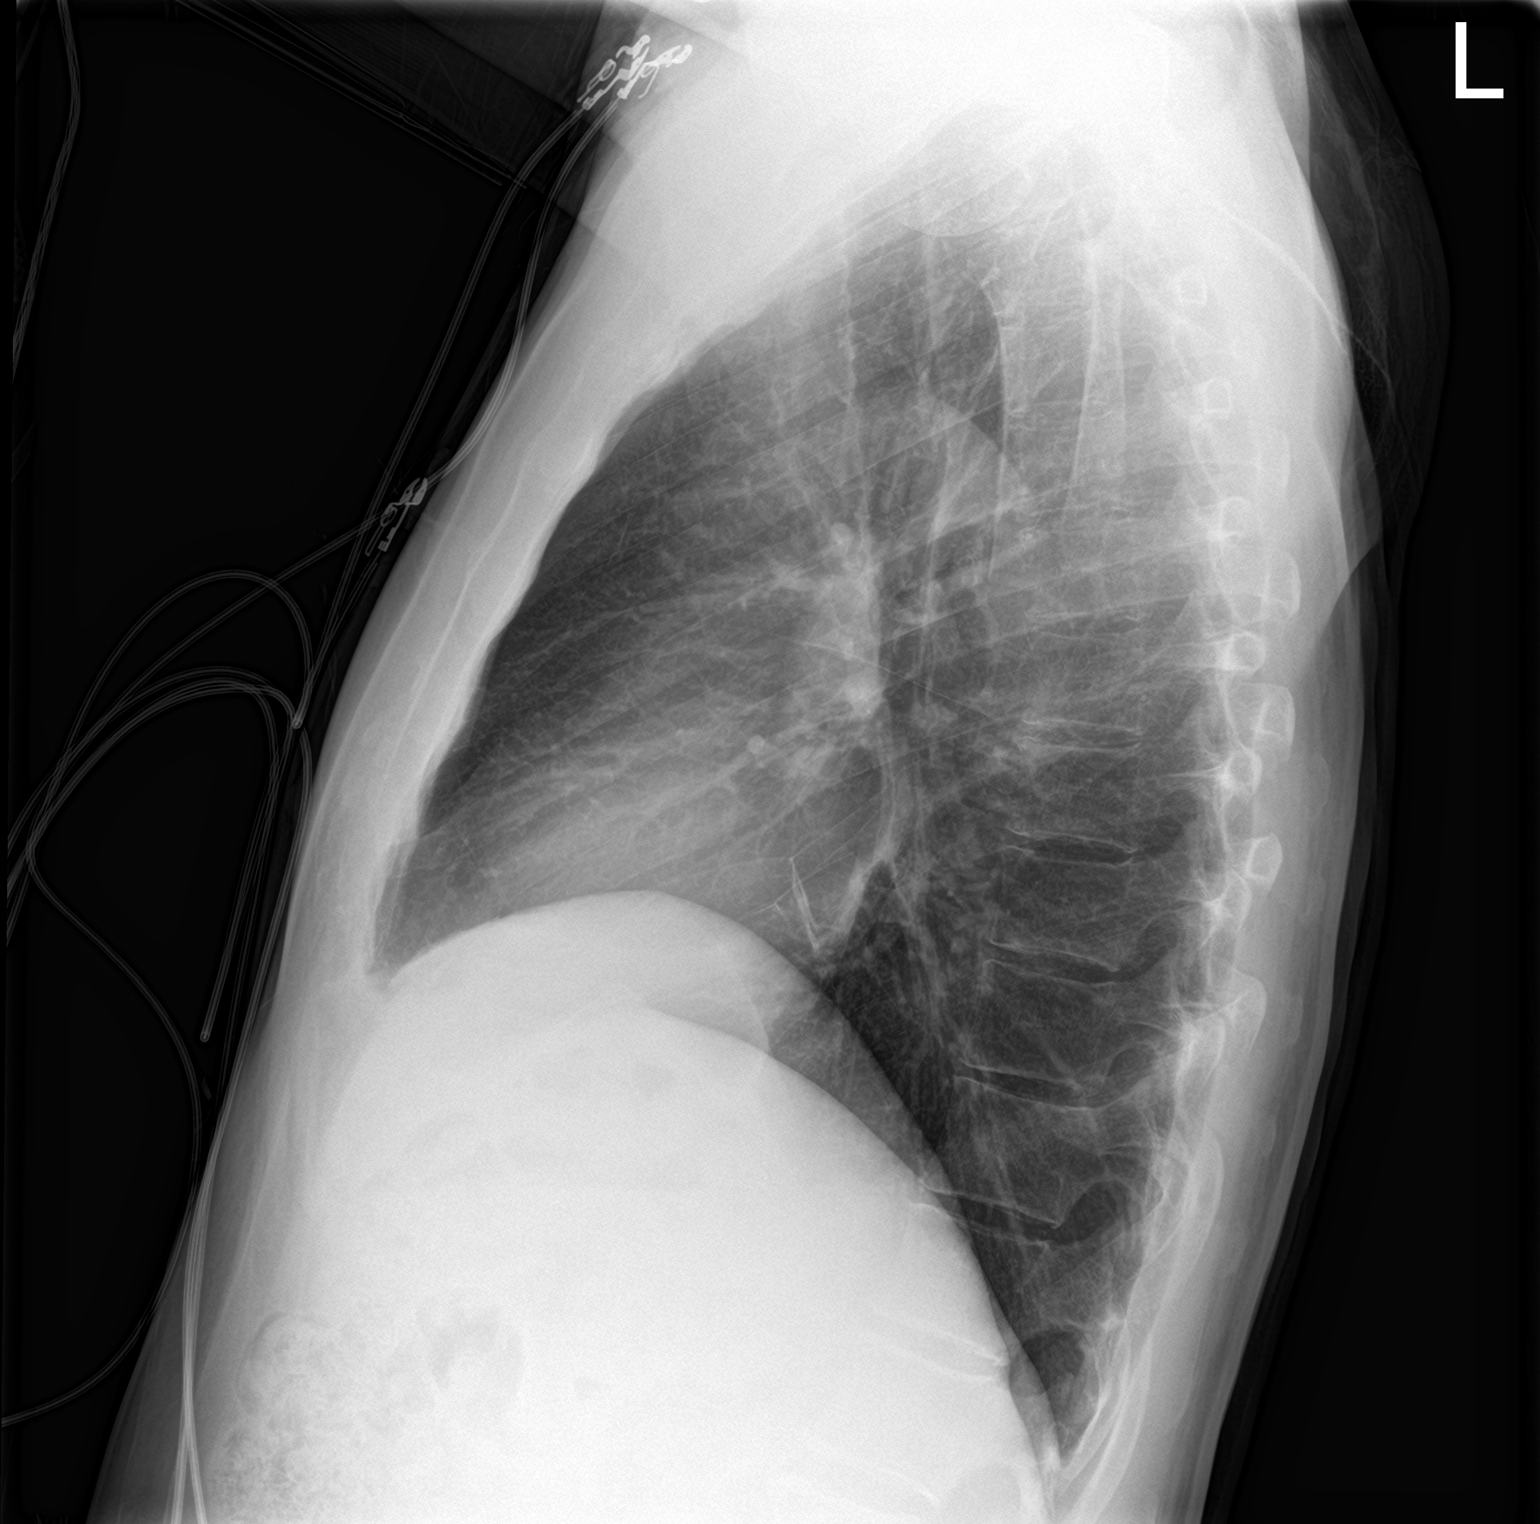

[2 of 2 positions shown; findings below may reference images not displayed]

FINDINGS: No consolidation, features of edema, pneumothorax, or effusion.
Pulmonary vascularity is normally distributed. The cardiomediastinal
contours are unremarkable. No acute osseous or soft tissue
abnormality. Degenerative changes are present in the imaged spine
and shoulders.
IMPRESSION: No acute cardiopulmonary abnormality.

## 2020-08-06 IMAGING — CT CT ABD-PELV W/ CM
2 of 5 series · 15 of 46 positions shown, 17 images · IV contrast (Omni 300)
Comparison: None

CLINICAL DATA: Unintended weight loss, 65 pounds in 2 weeks per
patient

EXAM:
CT ABDOMEN AND PELVIS WITH CONTRAST
TECHNIQUE: Multidetector CT imaging of the abdomen and pelvis was performed
using the standard protocol following bolus administration of
intravenous contrast.
CONTRAST:  100mL OMNIPAQUE IOHEXOL 300 MG/ML  SOLN

[Series 3: a/p w/ 5mm · axial · 0.83mm/px · z∈[+967,+1432]mm · 12 of 105 slices shown, 14 images]
[im 6/105  soft-tissue]
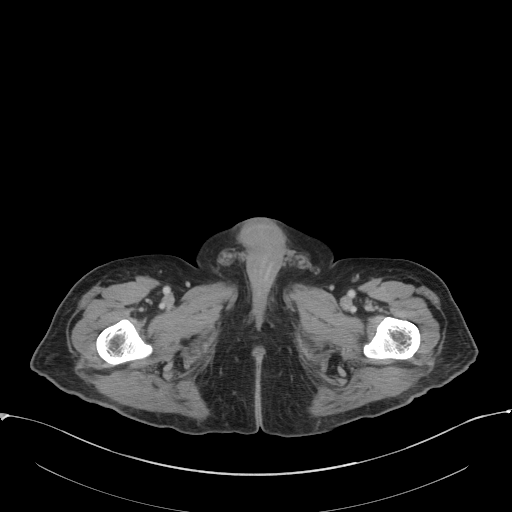
[im 6/105  bone]
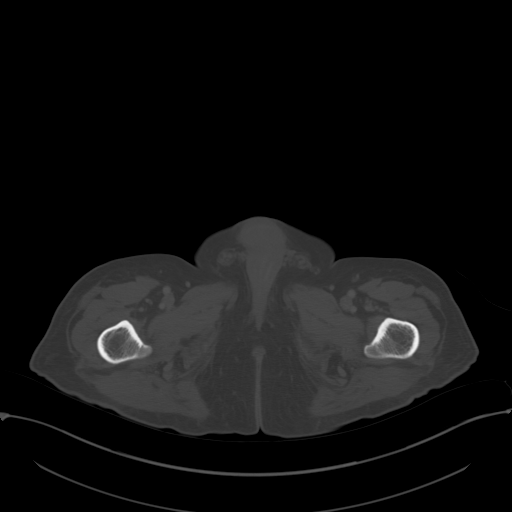
[im 17/105  soft-tissue]
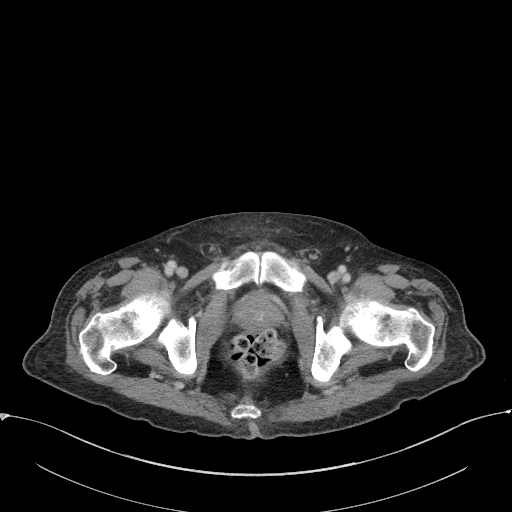
[im 22/105  soft-tissue]
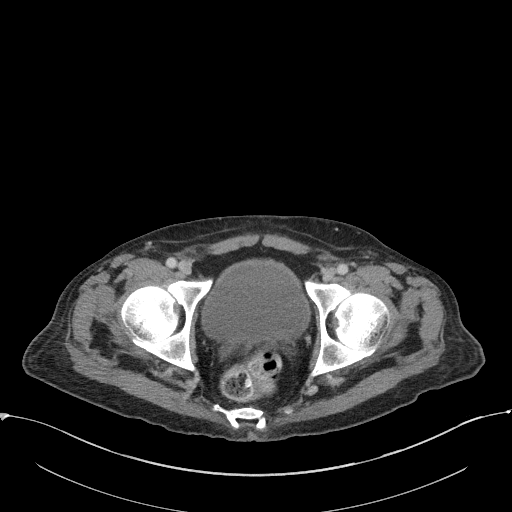
[im 33/105  soft-tissue]
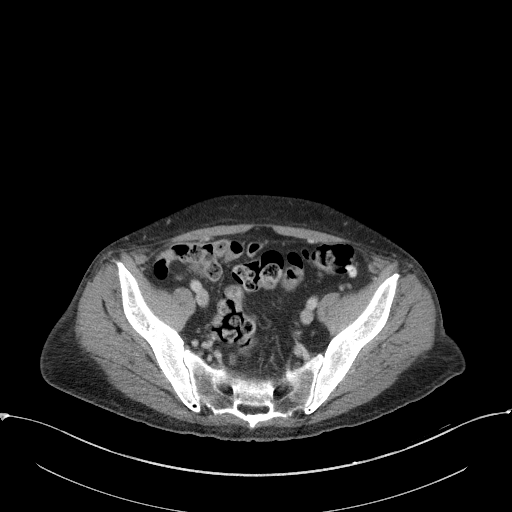
[im 39/105  soft-tissue]
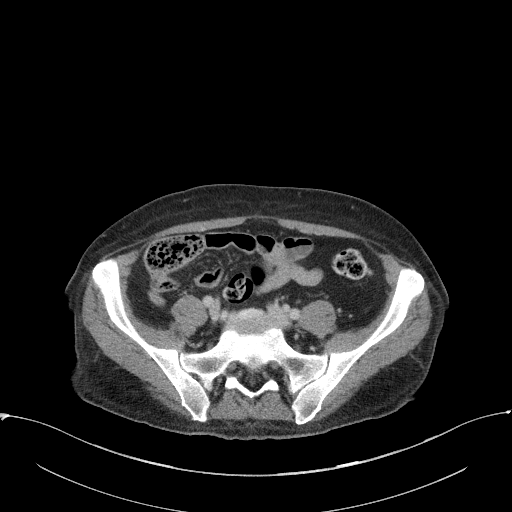
[im 50/105  soft-tissue]
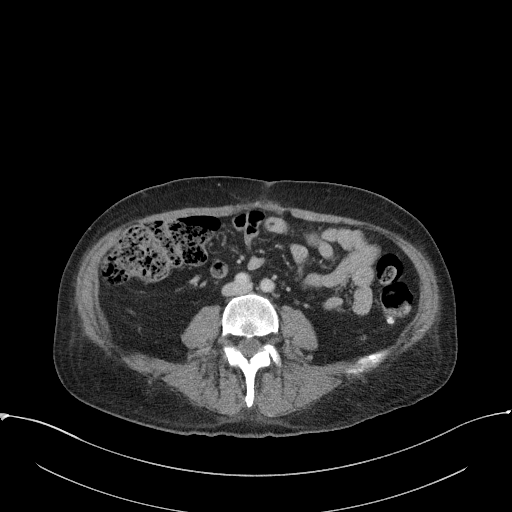
[im 55/105  soft-tissue]
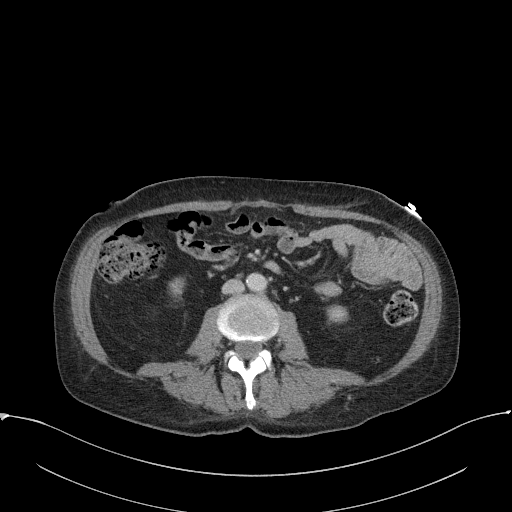
[im 66/105  soft-tissue]
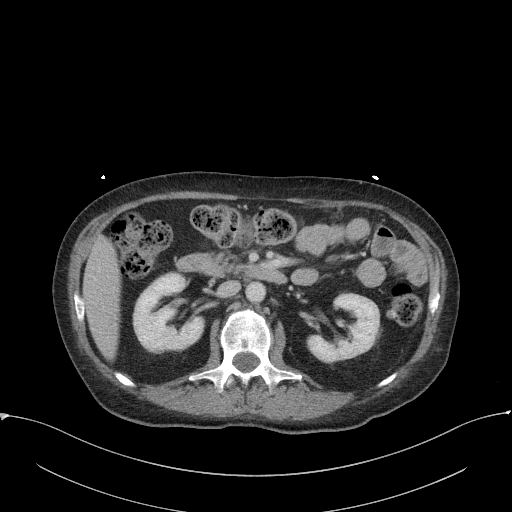
[im 72/105  soft-tissue]
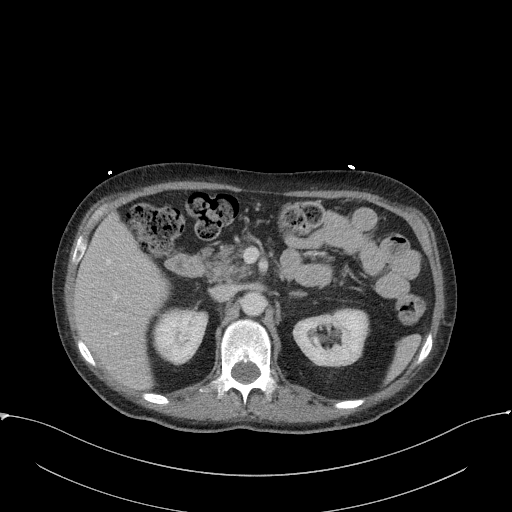
[im 72/105  bone]
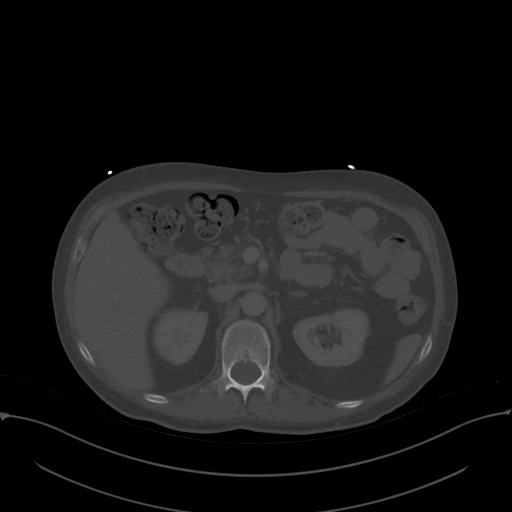
[im 83/105  soft-tissue]
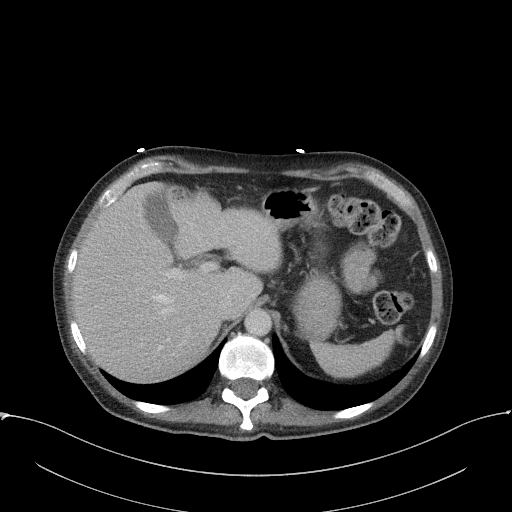
[im 88/105  soft-tissue]
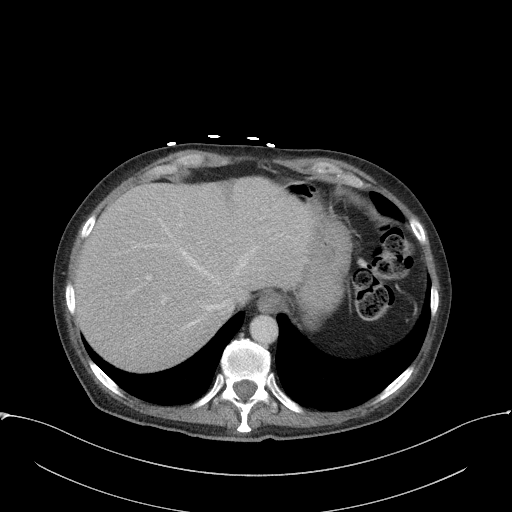
[im 99/105  soft-tissue]
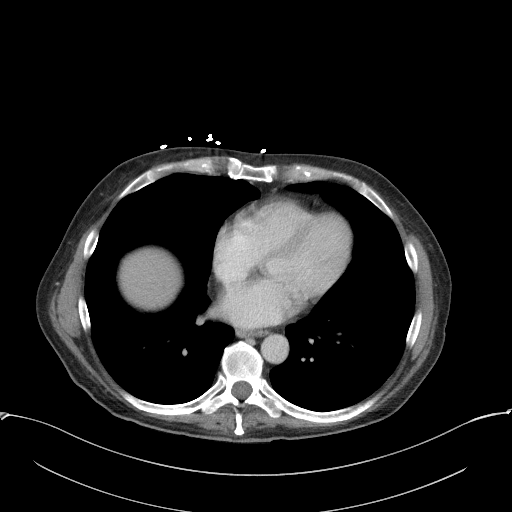

[Series 6: a/p w/ cor · coronal · 0.71mm/px · 3 of 126 slices shown]
[im 42/126  soft-tissue]
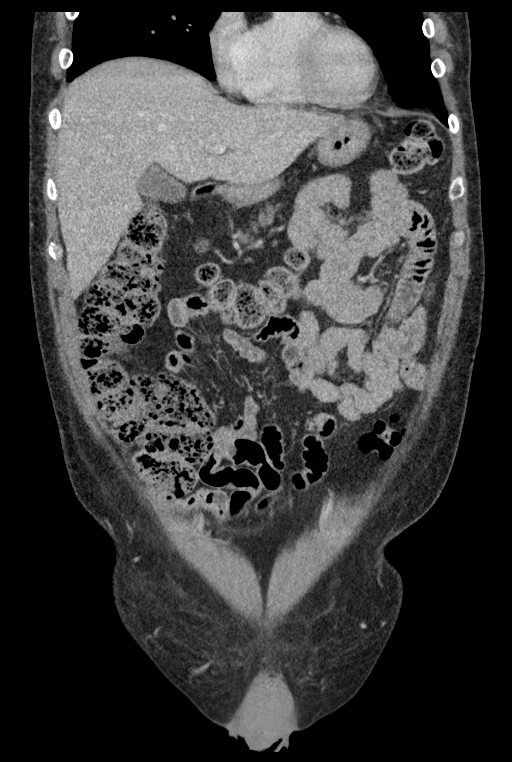
[im 56/126  soft-tissue]
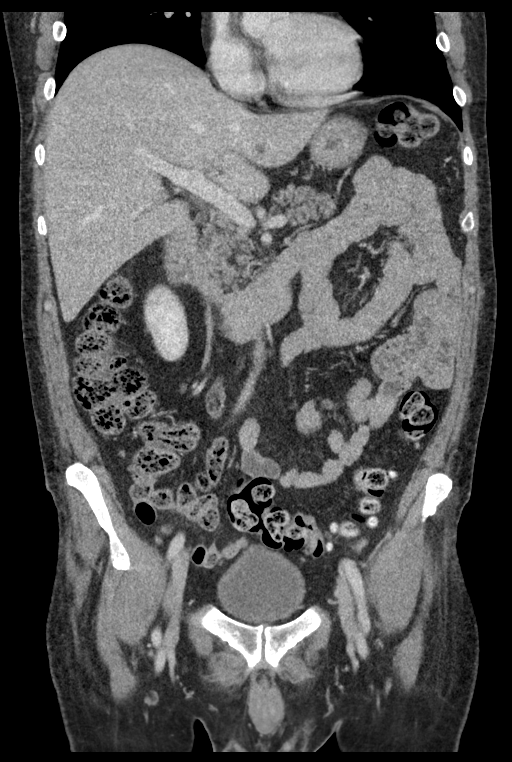
[im 70/126  soft-tissue]
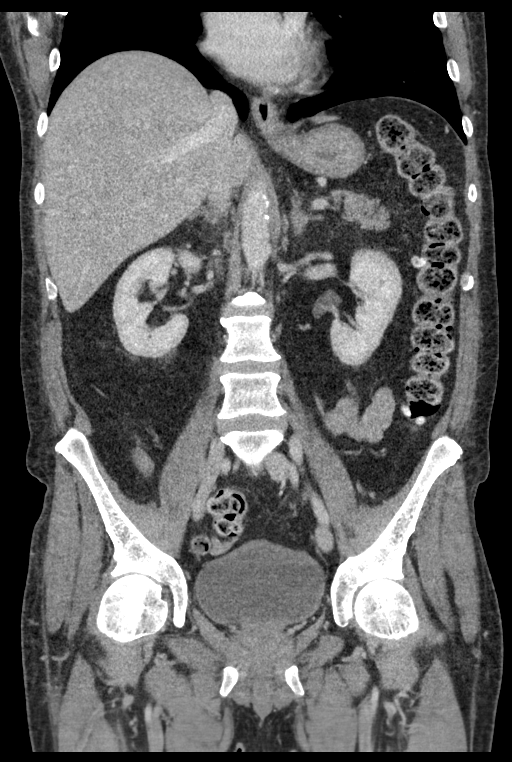

[15 of 46 positions shown; findings below may reference images not displayed]

FINDINGS: Lower chest: Small air cyst in the right lung base. Minimal
atelectatic change. Normal heart size. No pericardial effusion.

Hepatobiliary: Subcentimeter hypoattenuating focus along the
posterior left lobe liver ([DATE]) is too small to fully characterize
on CT imaging but statistically likely benign. No worrisome hepatic
lesions, no gallstones, gallbladder wall thickening, or biliary
dilatation.

Pancreas: Unremarkable. No pancreatic ductal dilatation or
surrounding inflammatory changes.

Spleen: Normal in size without focal abnormality.

Adrenals/Urinary Tract: Adrenal glands are unremarkable. Kidneys are
normal, without renal calculi, focal lesion, or hydronephrosis.
There is focal anterior bladder wall thickening.

Stomach/Bowel: Distal esophagus, stomach and duodenal sweep are
unremarkable. No small bowel wall thickening or dilatation. No
evidence of obstruction. A normal appendix is visualized. No colonic
dilatation or wall thickening. Scattered colonic diverticula without
focal pericolonic inflammation to suggest diverticulitis.

Vascular/Lymphatic: Atherosclerotic plaque within the normal caliber
aorta. No suspicious or enlarged lymph nodes in the included
lymphatic chains.

Reproductive: Coarse likely benign calcification of the prostate. No
concerning abnormalities of the prostate or seminal vesicles.

Other: Evidence of prior ventral hernia repair ([DATE]). No bowel
containing hernias. No free air or fluid in the abdomen or pelvis.

Musculoskeletal: Multilevel degenerative changes are present in the
imaged portions of the spine. No acute osseous abnormality or
suspicious osseous lesion.
IMPRESSION: 1. Focal anterior bladder wall thickening. Correlate with urinalysis
to exclude cystitis and consider further evaluation with cystoscopy.
2. Colonic diverticulosis without evidence of acute diverticulitis.
3. Evidence of prior ventral hernia repair. Small fat containing
bilateral inguinal hernias.
4.  Aortic Atherosclerosis ([id number]).

## 2020-09-20 ENCOUNTER — Ambulatory Visit: Payer: Medicare Other | Admitting: Internal Medicine

## 2020-10-04 ENCOUNTER — Other Ambulatory Visit: Payer: Self-pay

## 2020-10-04 ENCOUNTER — Encounter: Payer: Self-pay | Admitting: Internal Medicine

## 2020-10-04 ENCOUNTER — Ambulatory Visit (INDEPENDENT_AMBULATORY_CARE_PROVIDER_SITE_OTHER): Payer: Medicare Other | Admitting: Internal Medicine

## 2020-10-04 VITALS — BP 138/86 | HR 81 | Ht 72.0 in | Wt 193.0 lb

## 2020-10-04 DIAGNOSIS — E785 Hyperlipidemia, unspecified: Secondary | ICD-10-CM | POA: Diagnosis not present

## 2020-10-04 DIAGNOSIS — E119 Type 2 diabetes mellitus without complications: Secondary | ICD-10-CM

## 2020-10-04 LAB — BASIC METABOLIC PANEL
BUN: 19 mg/dL (ref 6–23)
CO2: 27 mEq/L (ref 19–32)
Calcium: 9.4 mg/dL (ref 8.4–10.5)
Chloride: 103 mEq/L (ref 96–112)
Creatinine, Ser: 0.84 mg/dL (ref 0.40–1.50)
GFR: 91.47 mL/min (ref >60.00)
Glucose, Bld: 121 mg/dL — ABNORMAL HIGH (ref 70–99)
Potassium: 4.1 mEq/L (ref 3.5–5.1)
Sodium: 139 mEq/L (ref 135–145)

## 2020-10-04 LAB — POCT GLYCOSYLATED HEMOGLOBIN (HGB A1C): Hemoglobin A1C: 6.3 % — AB (ref 4.0–5.6)

## 2020-10-04 MED ORDER — METFORMIN HCL ER 500 MG PO TB24: 1000.0000 mg | ORAL_TABLET | Freq: Two times a day (BID) | ORAL | 3 refills | Status: DC

## 2020-10-04 NOTE — Progress Notes (Signed)
Name: Gary Stewart  Age/ Sex: 65 y.o., male   MRN/ DOB: [id number], Sep 06, 1955     PCP: Cari Caraway, MD   Reason for Endocrinology Evaluation: Type 2 Diabetes Mellitus  Initial Endocrine Consultative Visit: 06/13/2019    PATIENT IDENTIFIER: Gary Stewart is a 65 y.o. male with a past medical history of sarcoma S/P resection, left foot drop and DM. The patient has followed with Endocrinology clinic since 06/13/2019 for consultative assistance with management of his diabetes.  DIABETIC HISTORY:  Gary Stewart was diagnosed with DM in 04/2019. Pt presented with symptomatic hyperglycemia with a serum glucose of 1076 mg/dL, with low Co2 at 19 mmol/L, elevated BHB 6.78 mmol/L and an elevated AG at 20, and an A1c > 18.5%. He was started on IV insulin and subsequently transitioned to MDI regimen.  He has not been seen by a physician in 3 years.   His GAD-65 and Islet cell antibody were negative    Stopped prandial insulin and started Metformin 08/2019 Stopped basal insulin 06/2020 SUBJECTIVE:   During the last visit (06/21/2020): A1c 5.7% .We continued Metformin, and stopped  Basal insulin.     Today (10/04/2020): Gary Stewart is here for a follow up on diabetes management.  He checks his blood sugars multiple  times daily, through the CGM.  The patient has not had hypoglycemic episodes since the last clinic visit  Denies nausea  or diarrhea   Has been eating a lot of fruit     HOME DIABETES REGIMEN:   Metformin 500 mg 1 tabs BID - taking 1 with breakfast and 2 tabs with Supper      Statin: yes ACE-I/ARB: no    METER DOWNLOAD SUMMARY:  CONTINUOUS GLUCOSE MONITORING RECORD INTERPRETATION    Dates of Recording: 5/16-5/29/2022 Sensor description:Freestyle libre  Results statistics:   CGM use % of time 100  Average and SD 148/16.6  Time in range    89 %  % Time Above 180 11  % Time above 250 0  % Time Below target 0     Glycemic patterns summary: Optimal  glycemic control with the occasional postprandial hyperglycemia at breakfast and supper   Hyperglycemic episodes  postprandial   Hypoglycemic episodes occurred N/A  Overnight periods: stable      DIABETIC COMPLICATIONS: Microvascular complications:    Denies: CKD, retinopathy, neuropathy  Last eye exam: Completed 22/2022   Macrovascular complications:    Denies: CAD, PVD, CVA   HISTORY:  Past Medical History:  Past Medical History:  Diagnosis Date   Diabetes ([place])    Hyperlipidemia    Wears glasses    Past Surgical History:  Past Surgical History:  Procedure Laterality Date   EPIGASTRIC HERNIA REPAIR N/A 07/10/2014   Procedure: INCARCERATED EPIGASTRIC HERNIA REPAIR WITH MESH;  Surgeon: Armandina Gemma, MD;  Location: [facility];  Service: General;  Laterality: N/A;   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 He has never used smokeless tobacco. He reports current alcohol use. He reports that he does not use drugs. Family History:  Family History  Problem Relation Age of Onset   Parkinson's disease Father    Diabetes Brother      HOME MEDICATIONS: Allergies as of 10/04/2020       Reactions   Penicillins Rash   Did it involve swelling of the face/tongue/throat, SOB, or low BP?  No Did it involve sudden or severe rash/hives, skin peeling, or any reaction on the inside of your mouth or nose? Yes Did you need to seek medical attention at a hospital or doctor's office? Yes When did it last happen?      2000 If all above answers are "NO", may proceed with cephalosporin use.        Medication List        Accurate as of October 04, 2020  9:57 AM. If you have any questions, ask your nurse or doctor.          STOP taking these medications    Insulin Pen Needle 31G X 5 MM Misc Stopped by: Dorita Sciara, MD   Lantus SoloStar 100 UNIT/ML Solostar Pen Generic drug:  insulin glargine Stopped by: Dorita Sciara, MD       TAKE these medications    atorvastatin 20 MG tablet Commonly known as: LIPITOR Take 20 mg by mouth daily.   blood glucose meter kit and supplies Dispense based on patient and insurance preference. Use up to four times daily as directed. (FOR ICD-10 E10.9, E11.9).   cholecalciferol 25 MCG (1000 UNIT) tablet Commonly known as: VITAMIN D3 Take 1,000 Units by mouth daily.   FreeStyle Libre 2 Sensor Misc USE TO TEST BLOOD SUGAR AS DIRECTED   metFORMIN 500 MG 24 hr tablet Commonly known as: GLUCOPHAGE-XR TAKE 1 TABLET(500 MG) BY MOUTH TWICE DAILY WITH A MEAL   OneTouch Delica Plus [id number] Misc USE TO CHECK BLOOD SUGAR EVERY DAY   sildenafil 20 MG tablet Commonly known as: REVATIO Take 20 mg by mouth daily as needed.         OBJECTIVE:   Vital Signs: BP 138/86   Pulse 81   Ht 6' (1.829 m)   Wt 193 lb (87.5 kg)   SpO2 99%   BMI 26.18 kg/m   Wt Readings from Last 3 Encounters:  10/04/20 193 lb (87.5 kg)  06/21/20 192 lb 8 oz (87.3 kg)  05/29/20 196 lb (88.9 kg)     Exam: General: Pt appears well and is in NAD  Lungs: Clear with good BS bilat with no rales, rhonchi, or wheezes  Heart: RRR with normal S1 and S2 and no gallops; no murmurs; no rub  Abdomen: Normoactive bowel sounds, soft, nontender, without masses or organomegaly palpable  Extremities: No pretibial edema.   Neuro: MS is good with appropriate affect, pt is alert and Ox3      DM foot exam: 06/21/2020 The skin of the feet is intact without sores or ulcerations. The pedal pulses are 2+ on right and 2+ on left. The sensation is intact to a screening 5.07, 10 gram monofilament bilaterally    DATA REVIEWED:  Lab Results  Component Value Date   HGBA1C 6.3 (A) 10/04/2020   HGBA1C 5.7 (A) 06/21/2020   HGBA1C 5.5 12/15/2019   Lab Results  Component Value Date   MICROALBUR <0.7 06/13/2019   CREATININE 0.78 04/27/2019   Lab Results   Component Value Date   MICRALBCREAT 0.9 06/13/2019   Results for RASHON, WESTRUP (MRN [id number]) as of 10/04/2020 16:27  Ref. Range 10/04/2020 10:14  Sodium Latest Ref Range: 135 - 145 mEq/L 139  Potassium Latest Ref Range: 3.5 - 5.1 mEq/L 4.1  Chloride Latest Ref Range: 96 - 112 mEq/L 103  CO2 Latest Ref Range: 19 - 32 mEq/L 27  Glucose Latest Ref Range: 70 - 99 mg/dL 121 (H)  BUN Latest  Ref Range: 6 - 23 mg/dL 19  Creatinine Latest Ref Range: 0.40 - 1.50 mg/dL 0.84  Calcium Latest Ref Range: 8.4 - 10.5 mg/dL 9.4  GFR Latest Ref Range: >60.00 mL/min 91.47     Results for MIR, FULLILOVE (MRN [id number]) as of 09/12/2019 12:39  Ref. Range 06/13/2019 14:17  Glutamic Acid Decarb Ab Latest Ref Range: <5 IU/mL <5   Results for DEWARREN, LEDBETTER (MRN [id number]) as of 09/12/2019 12:39  Ref. Range 06/13/2019 14:17  ISLET CELL ANTIBODY SCREEN Latest Ref Range: NEGATIVE  NEGATIVE   ASSESSMENT / PLAN / RECOMMENDATIONS:   1) Type 2 Diabetes Mellitus, Optimally controlled, Without complications - Most recent A1c of 6.3 %. Goal A1c < 7.0 %.     - A1c continues to be at goal but in review of his CGM download, pt noted with occasional hyperglycemic a in the 200's , will increase Metformin as below  - GAD-65 and Islet Ab levels are negative  - Will check C-Peptide today  -BMP normal   MEDICATIONS: - Increase Metformin 2 tablets BID   EDUCATION / INSTRUCTIONS: BG monitoring instructions: Patient is instructed to check his blood sugars 3 times a day Call [facility] if: BG persistently < 70 I reviewed the Rule of 15 for the treatment of hypoglycemia in detail with the patient. Literature supplied.    2) Diabetic complications:  Eye: Does not have known diabetic retinopathy.  Neuro/ Feet: He did have  mononeuropathy  (left foot drop)- but this resolved by 2021  Renal: Patient does not have known baseline CKD. He  Does NOT need to be on an ACEI/ARB at present, as his  renal function, MA/Cr ratio and BP are all normal    3) Dyslipdemia :  - LDL at 67 mg/dL , down from 140 mg/dL .Tolerating atorvastatin    Continue Atorvastatin 20 mg daily     F/U in 6 months    Signed electronically by: Mack Guise, MD  [facility]  [facility] [address] [place] [address], [place] [address] Phone: [phone] FAX: [phone]   CC: Cari Caraway, [place] [address] Phone: [phone]  Fax: [phone]  Return to Endocrinology clinic as below: No future appointments.

## 2020-10-04 NOTE — Patient Instructions (Signed)
-   Metformin 2 tablet daily with Breakfast and 2 tablets with Supper      HOW TO TREAT LOW BLOOD SUGARS (Blood sugar LESS THAN 70 MG/DL) Please follow the RULE OF 15 for the treatment of hypoglycemia treatment (when your (blood sugars are less than 70 mg/dL)   STEP 1: Take 15 grams of carbohydrates when your blood sugar is low, which includes:  3-4 GLUCOSE TABS  OR 3-4 OZ OF JUICE OR REGULAR SODA OR ONE TUBE OF GLUCOSE GEL    STEP 2: RECHECK blood sugar in 15 MINUTES STEP 3: If your blood sugar is still low at the 15 minute recheck --> then, go back to STEP 1 and treat AGAIN with another 15 grams of carbohydrates.  

## 2020-10-07 ENCOUNTER — Telehealth: Payer: Self-pay | Admitting: Internal Medicine

## 2020-10-07 LAB — C-PEPTIDE: C-Peptide: 1.95 ng/mL (ref 0.80–3.85)

## 2020-10-07 NOTE — Telephone Encounter (Signed)
Error

## 2021-04-04 ENCOUNTER — Other Ambulatory Visit: Payer: Self-pay

## 2021-04-04 ENCOUNTER — Encounter: Payer: Self-pay | Admitting: Internal Medicine

## 2021-04-04 ENCOUNTER — Ambulatory Visit (INDEPENDENT_AMBULATORY_CARE_PROVIDER_SITE_OTHER): Payer: Medicare Other | Admitting: Internal Medicine

## 2021-04-04 VITALS — BP 130/74 | HR 70 | Ht 72.0 in | Wt 191.4 lb

## 2021-04-04 DIAGNOSIS — E119 Type 2 diabetes mellitus without complications: Secondary | ICD-10-CM

## 2021-04-04 LAB — POCT GLYCOSYLATED HEMOGLOBIN (HGB A1C): Hemoglobin A1C: 6.3 % — AB (ref 4.0–5.6)

## 2021-04-04 LAB — BASIC METABOLIC PANEL
BUN: 19 mg/dL (ref 6–23)
CO2: 28 mEq/L (ref 19–32)
Calcium: 9.7 mg/dL (ref 8.4–10.5)
Chloride: 102 mEq/L (ref 96–112)
Creatinine, Ser: 0.8 mg/dL (ref 0.40–1.50)
GFR: 92.5 mL/min (ref >60.00)
Glucose, Bld: 130 mg/dL — ABNORMAL HIGH (ref 70–99)
Potassium: 4 mEq/L (ref 3.5–5.1)
Sodium: 139 mEq/L (ref 135–145)

## 2021-04-04 LAB — LIPID PANEL
Cholesterol: 132 mg/dL (ref 0–200)
HDL: 49.7 mg/dL (ref >39.00)
LDL Cholesterol: 54 mg/dL (ref 0–99)
NonHDL: 82.1
Total CHOL/HDL Ratio: 3
Triglycerides: 142 mg/dL (ref 0.0–149.0)
VLDL: 28.4 mg/dL (ref 0.0–40.0)

## 2021-04-04 LAB — MICROALBUMIN / CREATININE URINE RATIO
Creatinine,U: 121 mg/dL
Microalb Creat Ratio: 0.7 mg/g (ref 0.0–30.0)
Microalb, Ur: 0.9 mg/dL (ref 0.0–1.9)

## 2021-04-04 MED ORDER — METFORMIN HCL ER 500 MG PO TB24
1000.0000 mg | ORAL_TABLET | Freq: Two times a day (BID) | ORAL | 3 refills | Status: DC
Start: 2021-04-04 — End: 2021-09-25

## 2021-04-04 MED ORDER — METFORMIN HCL ER 500 MG PO TB24: 1000.0000 mg | ORAL_TABLET | Freq: Two times a day (BID) | ORAL | 3 refills | Status: DC

## 2021-04-04 MED ORDER — FREESTYLE LIBRE 3 SENSOR MISC: 1.0000 | 3 refills | Status: DC

## 2021-04-04 NOTE — Progress Notes (Signed)
Name: UKIAH TRAWICK  Age/ Sex: 65 y.o., male   MRN/ DOB: [id number], Jun 19, 1955     PCP: Cari Caraway, MD   Reason for Endocrinology Evaluation: Type 2 Diabetes Mellitus  Initial Endocrine Consultative Visit: 06/13/2019    PATIENT IDENTIFIER: [name] is a 65 y.o. male with a past medical history of sarcoma S/P resection, left foot drop and DM. The patient has followed with Endocrinology clinic since 06/13/2019 for consultative assistance with management of his diabetes.  DIABETIC HISTORY:  Mr. Micco was diagnosed with DM in 04/2019. Pt presented with symptomatic hyperglycemia with a serum glucose of 1076 mg/dL, with low Co2 at 19 mmol/L, elevated BHB 6.78 mmol/L and an elevated AG at 20, and an A1c > 18.5%. He was started on IV insulin and subsequently transitioned to MDI regimen.  He has not been seen by a physician in 3 years.   His GAD-65 and Islet cell antibody were negative    Stopped prandial insulin and started Metformin 08/2019 Stopped basal insulin 06/2020 SUBJECTIVE:   During the last visit (10/04/2020): A1c 6.3 % .We increased  Metformin    Today (04/04/2021): Mr. Bentz is here for a follow up on diabetes management.  He checks his blood sugars multiple  times daily, through the CGM.  The patient has had 1  episodes since the last clinic visit , this happens after exercise.   Denies nausea, vomiting or diarrhea     HOME DIABETES REGIMEN:   Metformin 500 mg 2 tabs    Statin: yes ACE-I/ARB: no    METER DOWNLOAD SUMMARY:  CONTINUOUS GLUCOSE MONITORING RECORD INTERPRETATION    Dates of Recording: 12/3-12/16/2022 Sensor description:Freestyle libre  Results statistics:   CGM use % of time 97  Average and SD 188/26  Time in range   54 %  % Time Above 180 34  % Time above 250 12  % Time Below target 0     Glycemic patterns summary: Optimal glycemic control during the night with hyperglycemia noted at the end of the day    Hyperglycemic episodes  postprandial   Hypoglycemic episodes occurred N/A  Overnight periods: stable      DIABETIC COMPLICATIONS: Microvascular complications:    Denies: CKD, retinopathy, neuropathy  Last eye exam: Completed 05/2020   Macrovascular complications:    Denies: CAD, PVD, CVA   HISTORY:  Past Medical History:  Past Medical History:  Diagnosis Date   Diabetes ([place])    Hyperlipidemia    Wears glasses    Past Surgical History:  Past Surgical History:  Procedure Laterality Date   EPIGASTRIC HERNIA REPAIR N/A 07/10/2014   Procedure: INCARCERATED EPIGASTRIC HERNIA REPAIR WITH MESH;  Surgeon: Armandina Gemma, MD;  Location: [facility];  Service: General;  Laterality: N/A;   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 He has never used smokeless tobacco. He reports current alcohol use. He reports that he does not use drugs. Family History:  Family History  Problem Relation Age of Onset   Parkinson's disease Father    Diabetes Brother      HOME MEDICATIONS: Allergies as of 04/04/2021       Reactions   Penicillins Rash   Did it involve swelling of the face/tongue/throat, SOB, or low BP? No Did it involve sudden or severe rash/hives, skin peeling, or any reaction on the inside of your  mouth or nose? Yes Did you need to seek medical attention at a hospital or doctor's office? Yes When did it last happen?      2000 If all above answers are NO, may proceed with cephalosporin use.        Medication List        Accurate as of April 04, 2021 10:23 AM. If you have any questions, ask your nurse or doctor.          atorvastatin 20 MG tablet Commonly known as: LIPITOR Take 20 mg by mouth daily.   blood glucose meter kit and supplies Dispense based on patient and insurance preference. Use up to four times daily as directed. (FOR ICD-10 E10.9, E11.9).    cholecalciferol 25 MCG (1000 UNIT) tablet Commonly known as: VITAMIN D3 Take 1,000 Units by mouth daily.   FreeStyle Libre 3 Sensor Misc 1 Device by Does not apply route every 14 (fourteen) days. What changed: See the new instructions. Changed by: Dorita Sciara, MD   metFORMIN 500 MG 24 hr tablet Commonly known as: GLUCOPHAGE-XR Take 2 tablets (1,000 mg total) by mouth in the morning and at bedtime.   OneTouch Delica Plus [id number] Misc USE TO CHECK BLOOD SUGAR EVERY DAY   sildenafil 20 MG tablet Commonly known as: REVATIO Take 20 mg by mouth daily as needed.         OBJECTIVE:   Vital Signs: BP 130/74 (BP Location: Left Arm, Patient Position: Sitting, Cuff Size: Small)    Pulse 70    Ht 6' (1.829 m)    Wt 191 lb 6.4 oz (86.8 kg)    SpO2 99%    BMI 25.96 kg/m   Wt Readings from Last 3 Encounters:  04/04/21 191 lb 6.4 oz (86.8 kg)  10/04/20 193 lb (87.5 kg)  06/21/20 192 lb 8 oz (87.3 kg)     Exam: General: Pt appears well and is in NAD  Lungs: Clear with good BS bilat with no rales, rhonchi, or wheezes  Heart: RRR with normal S1 and S2 and no gallops; no murmurs; no rub  Abdomen: Normoactive bowel sounds, soft, nontender, without masses or organomegaly palpable  Extremities: No pretibial edema.   Neuro: MS is good with appropriate affect, pt is alert and Ox3      DM foot exam: 04/04/2021 The skin of the feet is intact without sores or ulcerations. The pedal pulses are 2+ on right and 2+ on left. The sensation is intact to a screening 5.07, 10 gram monofilament bilaterally    DATA REVIEWED:  Lab Results  Component Value Date   HGBA1C 6.3 (A) 10/04/2020   HGBA1C 5.7 (A) 06/21/2020   HGBA1C 5.5 12/15/2019    Latest Reference Range & Units 04/04/21 10:45  Sodium 135 - 145 mEq/L 139  Potassium 3.5 - 5.1 mEq/L 4.0  Chloride 96 - 112 mEq/L 102  CO2 19 - 32 mEq/L 28  Glucose 70 - 99 mg/dL 130 (H)  BUN 6 - 23 mg/dL 19  Creatinine 0.40 - 1.50 mg/dL  0.80  Calcium 8.4 - 10.5 mg/dL 9.7    Latest Reference Range & Units 04/04/21 10:45  Total CHOL/HDL Ratio  3  Cholesterol 0 - 200 mg/dL 132  HDL Cholesterol >39.00 mg/dL 49.70  LDL (calc) 0 - 99 mg/dL 54  MICROALB/CREAT RATIO 0.0 - 30.0 mg/g 0.7  NonHDL  82.10  Triglycerides 0.0 - 149.0 mg/dL 142.0  VLDL 0.0 - 40.0 mg/dL 28.4    Results for  DIJUAN, SLEETH (MRN [id number]) as of 09/12/2019 12:39  Ref. Range 06/13/2019 14:17  Glutamic Acid Decarb Ab Latest Ref Range: <5 IU/mL <5   Results for NOLYN, SWAB (MRN [id number]) as of 09/12/2019 12:39  Ref. Range 06/13/2019 14:17  ISLET CELL ANTIBODY SCREEN Latest Ref Range: NEGATIVE  NEGATIVE    C-peptide - pending  ASSESSMENT / PLAN / RECOMMENDATIONS:   1) Type 2 Diabetes Mellitus, Optimally controlled, Without complications - Most recent A1c of 6.3 %. Goal A1c < 7.0 %.     - A1c continues to be at goal but in review of his CGM download, pt noted with postprandial hyperglycemia  - GAD-65 and Islet Ab levels are negative  - Will check C-Peptide today , back in 10/04/2020 it was 1.95 ng/mL with a concomitant glucose of 121 mg/dL  - NOT a candidate for SGLT-2 inhibitors due to Hx of DKA  -BMP normal     MEDICATIONS: - Continue  Metformin 500 mg , 2 tablets BID   EDUCATION / INSTRUCTIONS: BG monitoring instructions: Patient is instructed to check his blood sugars 3 times a day Call [facility] if: BG persistently < 70 I reviewed the Rule of 15 for the treatment of hypoglycemia in detail with the patient. Literature supplied.    2) Diabetic complications:  Eye: Does not have known diabetic retinopathy.  Neuro/ Feet: He did have  mononeuropathy  (left foot drop)- but this resolved by 2021  Renal: Patient does not have known baseline CKD. He  Does NOT need to be on an ACEI/ARB at present, as his renal function, MA/Cr ratio and BP are all normal    3) Dyslipdemia :  - LDL at 54 mg/dL , down from 140 mg/dL  .Tolerating atorvastatin    Continue Atorvastatin 20 mg daily     F/U in 6 months    Signed electronically by: Mack Guise, MD  [facility]  [facility] [address], [place] [address] Phone: [phone] FAX: [phone]   CC: Cari Caraway, [place] [address] Phone: [phone]  Fax: [phone]  Return to Endocrinology clinic as below: No future appointments.

## 2021-04-04 NOTE — Patient Instructions (Signed)
°-   Metformin 2 tablet daily with Breakfast and 2 tablets with Supper      HOW TO TREAT LOW BLOOD SUGARS (Blood sugar LESS THAN 70 MG/DL) Please follow the RULE OF 15 for the treatment of hypoglycemia treatment (when your (blood sugars are less than 70 mg/dL)   STEP 1: Take 15 grams of carbohydrates when your blood sugar is low, which includes:  3-4 GLUCOSE TABS  OR 3-4 OZ OF JUICE OR REGULAR SODA OR ONE TUBE OF GLUCOSE GEL    STEP 2: RECHECK blood sugar in 15 MINUTES STEP 3: If your blood sugar is still low at the 15 minute recheck --> then, go back to STEP 1 and treat AGAIN with another 15 grams of carbohydrates.  

## 2021-04-07 LAB — C-PEPTIDE: C-Peptide: 3.61 ng/mL (ref 0.80–3.85)

## 2021-09-25 ENCOUNTER — Other Ambulatory Visit: Payer: Self-pay | Admitting: Internal Medicine

## 2021-10-10 ENCOUNTER — Encounter: Payer: Self-pay | Admitting: Internal Medicine

## 2021-10-10 ENCOUNTER — Ambulatory Visit (INDEPENDENT_AMBULATORY_CARE_PROVIDER_SITE_OTHER): Payer: Medicare Other | Admitting: Internal Medicine

## 2021-10-10 VITALS — BP 136/72 | HR 78 | Ht 72.0 in | Wt 192.0 lb

## 2021-10-10 DIAGNOSIS — E119 Type 2 diabetes mellitus without complications: Secondary | ICD-10-CM

## 2021-10-10 DIAGNOSIS — E785 Hyperlipidemia, unspecified: Secondary | ICD-10-CM

## 2021-10-10 LAB — POCT GLYCOSYLATED HEMOGLOBIN (HGB A1C): Hemoglobin A1C: 6.6 % — AB (ref 4.0–5.6)

## 2022-03-02 ENCOUNTER — Other Ambulatory Visit: Payer: Self-pay | Admitting: Internal Medicine

## 2022-04-09 ENCOUNTER — Ambulatory Visit: Payer: Medicare Other | Admitting: Internal Medicine

## 2022-04-23 ENCOUNTER — Ambulatory Visit (INDEPENDENT_AMBULATORY_CARE_PROVIDER_SITE_OTHER): Payer: Medicare Other | Admitting: Internal Medicine

## 2022-04-23 ENCOUNTER — Encounter: Payer: Self-pay | Admitting: Internal Medicine

## 2022-04-23 VITALS — BP 124/82 | HR 78 | Ht 72.0 in | Wt 189.0 lb

## 2022-04-23 DIAGNOSIS — E785 Hyperlipidemia, unspecified: Secondary | ICD-10-CM

## 2022-04-23 DIAGNOSIS — E119 Type 2 diabetes mellitus without complications: Secondary | ICD-10-CM

## 2022-04-23 LAB — POCT GLYCOSYLATED HEMOGLOBIN (HGB A1C): Hemoglobin A1C: 7.2 % — AB (ref 4.0–5.6)

## 2022-04-23 MED ORDER — METFORMIN HCL ER 500 MG PO TB24: 1000.0000 mg | ORAL_TABLET | Freq: Two times a day (BID) | ORAL | 3 refills | Status: DC

## 2022-04-23 MED ORDER — ATORVASTATIN CALCIUM 20 MG PO TABS: 20.0000 mg | ORAL_TABLET | Freq: Every day | ORAL | 3 refills | Status: DC

## 2022-04-23 NOTE — Patient Instructions (Signed)
-   Metformin 2 tablet daily with Breakfast and 2 tablets with Supper      HOW TO TREAT LOW BLOOD SUGARS (Blood sugar LESS THAN 70 MG/DL) Please follow the RULE OF 15 for the treatment of hypoglycemia treatment (when your (blood sugars are less than 70 mg/dL)   STEP 1: Take 15 grams of carbohydrates when your blood sugar is low, which includes:  3-4 GLUCOSE TABS  OR 3-4 OZ OF JUICE OR REGULAR SODA OR ONE TUBE OF GLUCOSE GEL    STEP 2: RECHECK blood sugar in 15 MINUTES STEP 3: If your blood sugar is still low at the 15 minute recheck --> then, go back to STEP 1 and treat AGAIN with another 15 grams of carbohydrates.

## 2022-04-23 NOTE — Progress Notes (Signed)
Name: Gary Stewart  Age/ Sex: 67 y.o., male   MRN/ DOB: [id number], 1955/09/16     PCP: Cari Caraway, MD   Reason for Endocrinology Evaluation: Type 2 Diabetes Mellitus  Initial Endocrine Consultative Visit: 06/13/2019    PATIENT IDENTIFIER: Gary Stewart is a 67 y.o. male with a past medical history of sarcoma S/P resection, left foot drop and DM. The patient has followed with Endocrinology clinic since 06/13/2019 for consultative assistance with management of his diabetes.  DIABETIC HISTORY:  Gary Stewart was diagnosed with DM in 04/2019. Pt presented with symptomatic hyperglycemia with a serum glucose of 1076 mg/dL, with low Co2 at 19 mmol/L, elevated BHB 6.78 mmol/L and an elevated AG at 20, and an A1c > 18.5%. He was started on IV insulin and subsequently transitioned to MDI regimen.  He has not been seen by a physician in 3 years.   His GAD-65 and Islet cell antibody were negative    Stopped prandial insulin and started Metformin 08/2019 Stopped basal insulin 06/2020 SUBJECTIVE:   During the last visit (10/10/2021): A1c 6.6 % .We continued   Metformin    Today (04/23/2022): Gary Stewart is here for a follow up on diabetes management.  He checks his blood sugars multiple  times daily, through the CGM.  The patient has had 1  episodes since the last clinic visit , this happens after exercise.   Denies nausea, vomiting or diarrhea   Has noted ecrease exercise due to guests over    [place]:   Metformin 500 mg 2 tabs BID   Statin: yes ACE-I/ARB: no    METER DOWNLOAD SUMMARY:  CONTINUOUS GLUCOSE MONITORING RECORD INTERPRETATION    Dates of Recording: 04/10/2022-04/23/2022 Sensor description:Freestyle libre  Results statistics:   CGM use % of time 96  Average and SD 183/15.8  Time in range  51 %  % Time Above 180 45  % Time above 250 4  % Time Below target 0     Glycemic patterns summary: Optimal glycemic control during the night but  hyperglycemia noted during the day   Hyperglycemic episodes  postprandial   Hypoglycemic episodes occurred N/A  Overnight periods: stable      DIABETIC COMPLICATIONS: Microvascular complications:    Denies: CKD, retinopathy, neuropathy  Last eye exam: Completed 05/2021   Macrovascular complications:    Denies: CAD, PVD, CVA   HISTORY:  Past Medical History:  Past Medical History:  Diagnosis Date   Diabetes ([place])    Hyperlipidemia    Wears glasses    Past Surgical History:  Past Surgical History:  Procedure Laterality Date   EPIGASTRIC HERNIA REPAIR N/A 07/10/2014   Procedure: INCARCERATED EPIGASTRIC HERNIA REPAIR WITH MESH;  Surgeon: Armandina Gemma, MD;  Location: [facility];  Service: General;  Laterality: N/A;   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 He has never used smokeless tobacco. He reports current alcohol use. He reports that he does not use drugs. Family History:  Family History  Problem Relation Age of Onset   Parkinson's disease Father    Diabetes Brother      HOME MEDICATIONS: Allergies as of 04/23/2022       Reactions   Penicillins Rash   Did it involve swelling of the face/tongue/throat, SOB, or low BP? No Did it involve sudden or severe rash/hives, skin peeling, or any  reaction on the inside of your mouth or nose? Yes Did you need to seek medical attention at a hospital or doctor's office? Yes When did it last happen?      2000 If all above answers are "NO", may proceed with cephalosporin use.        Medication List        Accurate as of April 23, 2022 10:16 AM. If you have any questions, ask your nurse or doctor.          atorvastatin 20 MG tablet Commonly known as: LIPITOR Take 20 mg by mouth daily.   blood glucose meter kit and supplies Dispense based on patient and insurance preference. Use up to four times daily as directed. (FOR ICD-10  E10.9, E11.9).   cholecalciferol 25 MCG (1000 UNIT) tablet Commonly known as: VITAMIN D3 Take 1,000 Units by mouth daily.   FreeStyle Libre 3 Sensor Misc USE AS DIRECTED, CHANGE EVERY 14 DAYS   metFORMIN 500 MG 24 hr tablet Commonly known as: GLUCOPHAGE-XR TAKE 2 TABLETS(1000 MG) BY MOUTH IN THE MORNING AND AT BEDTIME   OneTouch Delica Plus [id number] Misc USE TO CHECK BLOOD SUGAR EVERY DAY   sildenafil 20 MG tablet Commonly known as: REVATIO Take 20 mg by mouth daily as needed.         OBJECTIVE:   Vital Signs: BP 124/82 (BP Location: Left Arm, Patient Position: Sitting, Cuff Size: Small)   Pulse 78   Ht 6' (1.829 m)   Wt 189 lb (85.7 kg)   SpO2 97%   BMI 25.63 kg/m   Wt Readings from Last 3 Encounters:  04/23/22 189 lb (85.7 kg)  10/10/21 192 lb (87.1 kg)  04/04/21 191 lb 6.4 oz (86.8 kg)     Exam: General: Pt appears well and is in NAD  Lungs: Clear with good BS bilat   Heart: RRR   Abdomen: Normoactive bowel sounds, soft, nontender, without masses or organomegaly palpable  Extremities: No pretibial edema.   Neuro: MS is good with appropriate affect, pt is alert and Ox3      DM foot exam: 04/23/2022 The skin of the feet is intact without sores or ulcerations. The pedal pulses are 2+ on right and 2+ on left. The sensation is intact to a screening 5.07, 10 gram monofilament bilaterally    DATA REVIEWED:  Lab Results  Component Value Date   HGBA1C 6.6 (A) 10/10/2021   HGBA1C 6.3 (A) 04/04/2021   HGBA1C 6.3 (A) 10/04/2020    Latest Reference Range & Units 04/04/21 10:45  Sodium 135 - 145 mEq/L 139  Potassium 3.5 - 5.1 mEq/L 4.0  Chloride 96 - 112 mEq/L 102  CO2 19 - 32 mEq/L 28  Glucose 70 - 99 mg/dL 130 (H)  BUN 6 - 23 mg/dL 19  Creatinine 0.40 - 1.50 mg/dL 0.80  Calcium 8.4 - 10.5 mg/dL 9.7    Latest Reference Range & Units 04/04/21 10:45  Total CHOL/HDL Ratio  3  Cholesterol 0 - 200 mg/dL 132  HDL Cholesterol >39.00 mg/dL 49.70  LDL (calc)  0 - 99 mg/dL 54  MICROALB/CREAT RATIO 0.0 - 30.0 mg/g 0.7  NonHDL  82.10  Triglycerides 0.0 - 149.0 mg/dL 142.0  VLDL 0.0 - 40.0 mg/dL 28.4    Results for Gary Stewart, Gary Stewart (MRN [id number]) as of 09/12/2019 12:39  Ref. Range 06/13/2019 14:17  Glutamic Acid Decarb Ab Latest Ref Range: <5 IU/mL <5   Results for Gary Stewart, Gary Stewart (MRN [id number]) as of  09/12/2019 12:39  Ref. Range 06/13/2019 14:17  ISLET CELL ANTIBODY SCREEN Latest Ref Range: NEGATIVE  NEGATIVE   04/04/2021  Glucose 130 mg/dL  C-peptide 3.61 ng/mL  ASSESSMENT / PLAN / RECOMMENDATIONS:   1) Type 2 Diabetes Mellitus, Optimally controlled, Without complications - Most recent A1c of 7.2 %. Goal A1c < 7.0 %.     -Slight increase in his A1c, patient will return to exercise and low-carb diet - GAD-65 and Islet Ab levels are negative  - C- Peptide is detecale at 3.61 with a serum glucose of 130 mg/dL 03/2021  - NOT a candidate for SGLT-2 inhibitors due to Hx of DKA   MEDICATIONS: - Continue  Metformin 500 mg , 2 tablets BID   EDUCATION / INSTRUCTIONS: BG monitoring instructions: Patient is instructed to check his blood sugars 3 times a day Call [facility] if: BG persistently < 70 I reviewed the Rule of 15 for the treatment of hypoglycemia in detail with the patient. Literature supplied.    2) Diabetic complications:  Eye: Does not have known diabetic retinopathy.  Neuro/ Feet: He did have  mononeuropathy  (left foot drop)- but this resolved by 2021  Renal: Patient does not have known baseline CKD. He  Does NOT need to be on an ACEI/ARB at present, as his renal function, MA/Cr ratio and BP are all normal    3) Dyslipdemia :  - LDL at 54 mg/dL , down from 140 mg/dL  -He is scheduled for physical with his PCP and will have labs drawn then    Continue Atorvastatin 20 mg daily     F/U in 6 months    Signed electronically by: Mack Guise, MD  [facility] End[facility]in[facility]  [facility] [address] [place], [place] [address] Phone: [phone] FAX: [phone]   CC: Cari Caraway, [place] [address] Phone: [phone]  Fax: [phone]  Return to Endocrinology clinic as below: No future appointments.

## 2022-08-24 ENCOUNTER — Other Ambulatory Visit: Payer: Self-pay

## 2022-08-24 MED ORDER — FREESTYLE LIBRE 3 SENSOR MISC: 3 refills | Status: DC

## 2022-10-27 ENCOUNTER — Encounter: Payer: Self-pay | Admitting: Internal Medicine

## 2022-10-27 ENCOUNTER — Ambulatory Visit (INDEPENDENT_AMBULATORY_CARE_PROVIDER_SITE_OTHER): Payer: Medicare Other | Admitting: Internal Medicine

## 2022-10-27 VITALS — BP 134/86 | HR 91 | Ht 72.0 in | Wt 189.0 lb

## 2022-10-27 DIAGNOSIS — Z7984 Long term (current) use of oral hypoglycemic drugs: Secondary | ICD-10-CM

## 2022-10-27 DIAGNOSIS — E119 Type 2 diabetes mellitus without complications: Secondary | ICD-10-CM | POA: Diagnosis not present

## 2022-10-27 DIAGNOSIS — E785 Hyperlipidemia, unspecified: Secondary | ICD-10-CM

## 2022-10-27 LAB — POCT GLYCOSYLATED HEMOGLOBIN (HGB A1C): Hemoglobin A1C: 6.8 % — AB (ref 4.0–5.6)

## 2022-10-27 MED ORDER — ATORVASTATIN CALCIUM 20 MG PO TABS: 20.0000 mg | ORAL_TABLET | Freq: Every day | ORAL | 3 refills | Status: DC

## 2022-10-27 MED ORDER — METFORMIN HCL ER 500 MG PO TB24: 1000.0000 mg | ORAL_TABLET | Freq: Two times a day (BID) | ORAL | 3 refills | Status: DC

## 2022-10-27 NOTE — Progress Notes (Signed)
Name: Gary Stewart  Age/ Sex: 67 y.o., male   MRN/ DOB: [id number], [birth year]     PCP: Gweneth Dimitri, MD   Reason for Endocrinology Evaluation: Type 2 Diabetes Mellitus  Initial Endocrine Consultative Visit: 06/13/2019    PATIENT IDENTIFIER: [name] is a 67 y.o. male with a past medical history of sarcoma S/P resection, left foot drop and DM. The patient has followed with Endocrinology clinic since 06/13/2019 for consultative assistance with management of his diabetes.  DIABETIC HISTORY:  Gary Stewart was diagnosed with DM in 04/2019. Pt presented with symptomatic hyperglycemia with a serum glucose of 1076 mg/dL, with low Co2 at 19 mmol/L, elevated BHB 6.78 mmol/L and an elevated AG at 20, and an A1c > 18.5%. He was started on IV insulin and subsequently transitioned to MDI regimen.  He has not been seen by a physician in 3 years.   His GAD-65 and Islet cell antibody were negative    Stopped prandial insulin and started Metformin 08/2019 Stopped basal insulin 06/2020 SUBJECTIVE:   During the last visit (04/23/2022): A1c 7.2 %     Today (10/27/2022): Gary Stewart is here for a follow up on diabetes management.  He checks his blood sugars multiple  times daily, through the CGM.  The patient has not had hypoglycemic episodes  Denies nausea, vomiting Denies constipation or diarrhea    HOME DIABETES REGIMEN:   Metformin 500 mg 2 tabs BID   Statin: yes ACE-I/ARB: no    METER DOWNLOAD SUMMARY:  CONTINUOUS GLUCOSE MONITORING RECORD INTERPRETATION    Dates of Recording: 6/26-10/27/2022 Sensor description:Freestyle libre  Results statistics:   CGM use % of time 96  Average and SD 170/17.8  Time in range 69 %  % Time Above 180 30  % Time above 250 1  % Time Below target 0     Glycemic patterns summary: BG's are optimal overnight and fluctuate during the day  Hyperglycemic episodes  postprandial   Hypoglycemic episodes occurred N/A  Overnight  periods: Optimal     DIABETIC COMPLICATIONS: Microvascular complications:    Denies: CKD, retinopathy, neuropathy  Last eye exam: Completed 05/2021   Macrovascular complications:    Denies: CAD, PVD, CVA   HISTORY:  Past Medical History:  Past Medical History:  Diagnosis Date   Diabetes (HCC)    Hyperlipidemia    Wears glasses    Past Surgical History:  Past Surgical History:  Procedure Laterality Date   EPIGASTRIC HERNIA REPAIR N/A 07/10/2014   Procedure: INCARCERATED EPIGASTRIC HERNIA REPAIR WITH MESH;  Surgeon: Darnell Level, MD;  Location: [facility];  Service: General;  Laterality: N/A;   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 He has never used smokeless tobacco. He reports current alcohol use. He reports that he does not use drugs. Family History:  Family History  Problem Relation Age of Onset   Parkinson's disease Father    Diabetes Brother      HOME MEDICATIONS: Allergies as of 10/27/2022       Reactions   Penicillins Rash   Did it involve swelling of the face/tongue/throat, SOB, or low BP? No Did it involve sudden or severe rash/hives, skin peeling, or any reaction on the inside of your mouth or nose? Yes Did you need to seek medical attention at a hospital or doctor's office? Yes When did it last happen?  2000 If all above answers are "NO", may proceed with cephalosporin use.        Medication List        Accurate as of October 27, 2022 10:11 AM. If you have any questions, ask your nurse or doctor.          atorvastatin 20 MG tablet Commonly known as: LIPITOR Take 1 tablet (20 mg total) by mouth daily.   blood glucose meter kit and supplies Dispense based on patient and insurance preference. Use up to four times daily as directed. (FOR ICD-10 E10.9, E11.9).   cholecalciferol 25 MCG (1000 UNIT) tablet Commonly known as: VITAMIN D3 Take 1,000 Units by  mouth daily.   FreeStyle Libre 3 Sensor Misc USE AS DIRECTED, CHANGE EVERY 14 DAYS   metFORMIN 500 MG 24 hr tablet Commonly known as: GLUCOPHAGE-XR Take 2 tablets (1,000 mg total) by mouth 2 (two) times daily with a meal.   OneTouch Delica Plus Lancet30G Misc USE TO CHECK BLOOD SUGAR EVERY DAY   sildenafil 20 MG tablet Commonly known as: REVATIO Take 20 mg by mouth daily as needed.         OBJECTIVE:   Vital Signs: BP 134/86 (BP Location: Left Arm, Patient Position: Sitting, Cuff Size: Small)   Pulse 91   Ht 6' (1.829 m)   Wt 189 lb (85.7 kg)   SpO2 96%   BMI 25.63 kg/m   Wt Readings from Last 3 Encounters:  10/27/22 189 lb (85.7 kg)  04/23/22 189 lb (85.7 kg)  10/10/21 192 lb (87.1 kg)     Exam: General: Pt appears well and is in NAD  Lungs: Clear with good BS bilat   Heart: RRR   Abdomen: Normoactive bowel sounds, soft, nontender, without masses or organomegaly palpable  Extremities: No pretibial edema.   Neuro: MS is good with appropriate affect, pt is alert and Ox3      DM foot exam: 04/23/2022 The skin of the feet is intact without sores or ulcerations. The pedal pulses are 2+ on right and 2+ on left. The sensation is intact to a screening 5.07, 10 gram monofilament bilaterally    DATA REVIEWED:  Lab Results  Component Value Date   HGBA1C 7.2 (A) 04/23/2022   HGBA1C 6.6 (A) 10/10/2021   HGBA1C 6.3 (A) 04/04/2021    Latest Reference Range & Units 04/04/21 10:45  Sodium 135 - 145 mEq/L 139  Potassium 3.5 - 5.1 mEq/L 4.0  Chloride 96 - 112 mEq/L 102  CO2 19 - 32 mEq/L 28  Glucose 70 - 99 mg/dL 161 (H)  BUN 6 - 23 mg/dL 19  Creatinine 0.96 - 0.45 mg/dL 4.09  Calcium 8.4 - 81.1 mg/dL 9.7    Latest Reference Range & Units 04/04/21 10:45  Total CHOL/HDL Ratio  3  Cholesterol 0 - 200 mg/dL 914  HDL Cholesterol >78.29 mg/dL 56.21  LDL (calc) 0 - 99 mg/dL 54  MICROALB/CREAT RATIO 0.0 - 30.0 mg/g 0.7  NonHDL  82.10  Triglycerides 0.0 - 149.0 mg/dL  308.6  VLDL 0.0 - 57.8 mg/dL 46.9    Results for Gary Stewart, Gary Stewart (MRN [id number]) as of 09/12/2019 12:39  Ref. Range 06/13/2019 14:17  Glutamic Acid Decarb Ab Latest Ref Range: <5 IU/mL <5   Results for Gary Stewart, Gary Stewart (MRN [id number]) as of 09/12/2019 12:39  Ref. Range 06/13/2019 14:17  ISLET CELL ANTIBODY SCREEN Latest Ref Range: NEGATIVE  NEGATIVE   04/04/2021  Glucose 130 mg/dL  C-peptide 5.36 ng/mL  ASSESSMENT / PLAN / RECOMMENDATIONS:   1) Type 2 Diabetes Mellitus, Optimally controlled, Without complications - Most recent A1c of 6.8 %. Goal A1c < 7.0 %.     - - GAD-65 and Islet Ab levels are negative his A1c is at goal but the patient has been noted with postprandial hyperglycemia >200 mg/dL, most of his fasting BG's are above goal of 70-1 30 mg/DL -I did entertain the idea of sulfonylureas, to target postprandial hyperglycemia, patient would like to hold off on this for now and will continue metformin - C- Peptide is detecale at 3.61 with a serum glucose of 130 mg/dL 16/1096  - NOT a candidate for SGLT-2 inhibitors due to Hx of DKA   MEDICATIONS: - Continue  Metformin 500 mg , 2 tablets BID   EDUCATION / INSTRUCTIONS: BG monitoring instructions: Patient is instructed to check his blood sugars 3 times a day Call [facility] if: BG persistently < 70 I reviewed the Rule of 15 for the treatment of hypoglycemia in detail with the patient. Literature supplied.    2) Diabetic complications:  Eye: Does not have known diabetic retinopathy.  Neuro/ Feet: He did have  mononeuropathy  (left foot drop)- but this resolved by 2021  Renal: Patient does not have known baseline CKD. He  Does NOT need to be on an ACEI/ARB at present, as his renal function, MA/Cr ratio and BP are all normal    3) Dyslipdemia :  - LDL at 54 mg/dL , down from 045 mg/dL  -Labs done at PCPs office, not available at this time  Continue Atorvastatin 20 mg daily     F/U in 6 months     Signed electronically by: Lyndle Herrlich, MD  [facility]  [facility] [address] [place] [place], [place] [address] Phone: [phone] FAX: [phone]   CC: Gweneth Dimitri, MD [address] [place] [address] Phone: [phone]  Fax: [phone]  Return to Endocrinology clinic as below: No future appointments.

## 2022-10-27 NOTE — Patient Instructions (Signed)
°-   Metformin 2 tablet daily with Breakfast and 2 tablets with Supper  ° ° ° ° °HOW TO TREAT LOW BLOOD SUGARS (Blood sugar LESS THAN 70 MG/DL) °Please follow the RULE OF 15 for the treatment of hypoglycemia treatment (when your (blood sugars are less than 70 mg/dL)  ° °STEP 1: Take 15 grams of carbohydrates when your blood sugar is low, which includes:  °3-4 GLUCOSE TABS  OR °3-4 OZ OF JUICE OR REGULAR SODA OR °ONE TUBE OF GLUCOSE GEL   ° °STEP 2: RECHECK blood sugar in 15 MINUTES °STEP 3: If your blood sugar is still low at the 15 minute recheck --> then, go back to STEP 1 and treat AGAIN with another 15 grams of carbohydrates. ° ° °

## 2023-01-28 ENCOUNTER — Other Ambulatory Visit: Payer: Self-pay

## 2023-01-28 MED ORDER — FREESTYLE LIBRE 3 PLUS SENSOR MISC: 3 refills | Status: DC

## 2023-05-04 ENCOUNTER — Ambulatory Visit (INDEPENDENT_AMBULATORY_CARE_PROVIDER_SITE_OTHER): Payer: Medicare Other | Admitting: Internal Medicine

## 2023-05-04 ENCOUNTER — Encounter: Payer: Self-pay | Admitting: Internal Medicine

## 2023-05-04 VITALS — BP 122/80 | HR 88 | Ht 72.0 in | Wt 187.0 lb

## 2023-05-04 DIAGNOSIS — E785 Hyperlipidemia, unspecified: Secondary | ICD-10-CM

## 2023-05-04 DIAGNOSIS — Z7984 Long term (current) use of oral hypoglycemic drugs: Secondary | ICD-10-CM

## 2023-05-04 DIAGNOSIS — E119 Type 2 diabetes mellitus without complications: Secondary | ICD-10-CM | POA: Diagnosis not present

## 2023-05-04 LAB — POCT GLYCOSYLATED HEMOGLOBIN (HGB A1C): Hemoglobin A1C: 7.5 % — AB (ref 4.0–5.6)

## 2023-05-04 MED ORDER — ATORVASTATIN CALCIUM 20 MG PO TABS: 20.0000 mg | ORAL_TABLET | Freq: Every day | ORAL | 3 refills | Status: DC

## 2023-05-04 MED ORDER — METFORMIN HCL ER 500 MG PO TB24: 1000.0000 mg | ORAL_TABLET | Freq: Two times a day (BID) | ORAL | 3 refills | Status: DC

## 2023-05-04 NOTE — Progress Notes (Signed)
 Name: Gary Stewart  Age/ Sex: 68 y.o., male   MRN/ DOB: [id number], December 16, 1955     PCP: Aisha Harvey, MD   Reason for Endocrinology Evaluation: Type 2 Diabetes Mellitus  Initial Endocrine Consultative Visit: 06/13/2019    PATIENT IDENTIFIER: [name] is a 68 y.o. male with a past medical history of sarcoma S/P resection, left foot drop and DM. The patient has followed with Endocrinology clinic since 06/13/2019 for consultative assistance with management of his diabetes.    DIABETIC HISTORY:  Mr. Seckman was diagnosed with DM in 04/2019. Pt presented with symptomatic hyperglycemia with a serum glucose of 1076 mg/dL, with low Co2 at 19 mmol/L, elevated BHB 6.78 mmol/L and an elevated AG at 20, and an A1c > 18.5%. He was started on IV insulin  and subsequently transitioned to MDI regimen.  He has not been seen by a physician in 3 years.   His GAD-65 and Islet cell antibody were negative    Stopped prandial insulin  and started Metformin  08/2019 Stopped basal insulin  06/2020 SUBJECTIVE:   During the last visit (10/27/2022): A1c 6.8 %     Today (05/04/2023): [name] is here for a follow up on diabetes management.  He checks his blood sugars multiple  times daily, through the CGM.  The patient has not had hypoglycemic episodes  Denies nausea, vomiting Denies constipation or diarrhea    HOME DIABETES REGIMEN:  Metformin  500 mg 2 tabs BID Atorvastatin  20 mg daily      Statin: yes ACE-I/ARB: no    METER DOWNLOAD SUMMARY:  CONTINUOUS GLUCOSE MONITORING RECORD INTERPRETATION    Dates of Recording: 1/1-1/14/2025 Sensor description:Freestyle libre  Results statistics:   CGM use % of time 92  Average and SD 164/20.0  Time in range 73 %  % Time Above 180 25  % Time above 250 2  % Time Below target 0     Glycemic patterns summary: BGs have been fluctuating throughout the day and night  Hyperglycemic episodes  postprandial   Hypoglycemic episodes  occurred N/A  Overnight periods: Variable, but he recently have been optimal     DIABETIC COMPLICATIONS: Microvascular complications:    Denies: CKD, retinopathy, neuropathy  Last eye exam: Completed 05/2021   Macrovascular complications:    Denies: CAD, PVD, CVA   HISTORY:  Past Medical History:  Past Medical History:  Diagnosis Date   Diabetes (HCC)    Hyperlipidemia    Wears glasses    Past Surgical History:  Past Surgical History:  Procedure Laterality Date   EPIGASTRIC HERNIA REPAIR N/A 07/10/2014   Procedure: INCARCERATED EPIGASTRIC HERNIA REPAIR WITH MESH;  Surgeon: Krystal Spinner, MD;  Location: [facility];  Service: General;  Laterality: N/A;   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 He has never used smokeless tobacco. He reports current alcohol use. He reports that he does not use drugs. Family History:  Family History  Problem Relation Age of Onset   Parkinson's disease Father    Diabetes Brother      HOME MEDICATIONS: Allergies as of 05/04/2023       Reactions   Penicillins Rash   Did it involve swelling of the face/tongue/throat, SOB, or low BP? No Did it involve sudden or severe rash/hives, skin peeling, or any reaction on the inside of your mouth or nose? Yes Did you need to seek  medical attention at a hospital or doctor's office? Yes When did it last happen?      2000 If all above answers are "NO", may proceed with cephalosporin use.        Medication List        Accurate as of May 04, 2023 10:11 AM. If you have any questions, ask your nurse or doctor.          atorvastatin  20 MG tablet Commonly known as: LIPITOR Take 1 tablet (20 mg total) by mouth daily.   blood glucose meter kit and supplies Dispense based on patient and insurance preference. Use up to four times daily as directed. (FOR ICD-10 E10.9, E11.9).   cholecalciferol 25 MCG  (1000 UNIT) tablet Commonly known as: VITAMIN D3 Take 1,000 Units by mouth daily.   FreeStyle Libre 3 Sensor Misc USE AS DIRECTED, CHANGE EVERY 14 DAYS   FreeStyle Libre 3 Plus Sensor Misc Change sensor every 15 days.   metFORMIN  500 MG 24 hr tablet Commonly known as: GLUCOPHAGE -XR Take 2 tablets (1,000 mg total) by mouth 2 (two) times daily with a meal.   OneTouch Delica Plus Lancet30G Misc USE TO CHECK BLOOD SUGAR EVERY DAY   sildenafil 20 MG tablet Commonly known as: REVATIO Take 20 mg by mouth daily as needed.   tadalafil 20 MG tablet Commonly known as: CIALIS Take 20 mg by mouth daily as needed.         OBJECTIVE:   Vital Signs: BP 122/80 (BP Location: Left Arm, Patient Position: Sitting, Cuff Size: Small)   Pulse 88   Ht 6' (1.829 m)   Wt 187 lb (84.8 kg)   SpO2 97%   BMI 25.36 kg/m   Wt Readings from Last 3 Encounters:  05/04/23 187 lb (84.8 kg)  10/27/22 189 lb (85.7 kg)  04/23/22 189 lb (85.7 kg)     Exam: General: Pt appears well and is in NAD  Lungs: Clear with good BS bilat   Heart: RRR   Abdomen:  soft, nontender  Extremities: No pretibial edema.   Neuro: MS is good with appropriate affect, pt is alert and Ox3      DM foot exam: 05/04/2023 The skin of the feet is intact without sores or ulcerations. The pedal pulses are 2+ on right and 2+ on left. The sensation is intact to a screening 5.07, 10 gram monofilament bilaterally    DATA REVIEWED:  Lab Results  Component Value Date   HGBA1C 6.8 (A) 10/27/2022   HGBA1C 7.2 (A) 04/23/2022   HGBA1C 6.6 (A) 10/10/2021     Latest Reference Range & Units 05/04/23 10:37  Sodium 135 - 146 mmol/L 139  Potassium 3.5 - 5.3 mmol/L 4.5  Chloride 98 - 110 mmol/L 107  CO2 20 - 32 mmol/L 26  Glucose 65 - 99 mg/dL 851 (H)  BUN 7 - 25 mg/dL 21  Creatinine 9.29 - 8.64 mg/dL 9.00  Calcium  8.6 - 10.3 mg/dL 9.3  BUN/Creatinine Ratio 6 - 22 (calc) SEE NOTE:  AG Ratio 1.0 - 2.5 (calc) 2.0  AST 10 - 35  U/L 13  ALT 9 - 46 U/L 14  Total Protein 6.1 - 8.1 g/dL 7.0  Total Bilirubin 0.2 - 1.2 mg/dL 0.9  Total CHOL/HDL Ratio <5.0 (calc) 2.5  Cholesterol <200 mg/dL 863  HDL Cholesterol > OR = 40 mg/dL 54  LDL Cholesterol (Calc) mg/dL (calc) 65  MICROALB/CREAT RATIO <30 mg/g creat 2  Non-HDL Cholesterol (Calc) <130 mg/dL (calc) 82  Triglycerides <150 mg/dL 83  Alkaline phosphatase (APISO) 35 - 144 U/L 44  Globulin 1.9 - 3.7 g/dL (calc) 2.3  TSH 9.59 - 4.50 mIU/L 1.61  T4,Free(Direct) 0.8 - 1.8 ng/dL 1.2  Albumin MSPROF 3.6 - 5.1 g/dL 4.7  Microalb, Ur mg/dL 0.5  Creatinine, Urine 20 - 320 mg/dL 787     Results for KRISTOFFER, BALA (MRN [id number]) as of 09/12/2019 12:39  Ref. Range 06/13/2019 14:17  Glutamic Acid Decarb Ab Latest Ref Range: <5 IU/mL <5   Results for Haxton, Jaziel R (MRN [id number]) as of 09/12/2019 12:39  Ref. Range 06/13/2019 14:17  ISLET CELL ANTIBODY SCREEN Latest Ref Range: NEGATIVE  NEGATIVE   04/04/2021  Glucose 130 mg/dL  C-peptide 6.38 ng/mL  ASSESSMENT / PLAN / RECOMMENDATIONS:   1) Type 2 Diabetes Mellitus, Sub- Optimally controlled, Without complications - Most recent A1c of 7.5 %. Goal A1c < 7.0 %.    -A1c has trended up -Patient has made dietary changes with reducing bread intake and has noted improvement -Would like to continue with lifestyle changes and avoid any medication changes at this time - GAD-65 and Islet Ab levels are negative his A1c is at goal but the patient has been noted with postprandial hyperglycemia >200 mg/dL, most of his fasting BG's are above goal of 70-1 30 mg/DL - C- Peptide is detecale at 3.61 with a serum glucose of 130 mg/dL 87/7977  - NOT a candidate for SGLT-2 inhibitors due to Hx of DKA   MEDICATIONS: - Continue  Metformin  500 mg , 2 tablets BID   EDUCATION / INSTRUCTIONS: BG monitoring instructions: Patient is instructed to check his blood sugars 3 times a day Call [facility] if: BG persistently <  70 I reviewed the Rule of 15 for the treatment of hypoglycemia in detail with the patient. Literature supplied.    2) Diabetic complications:  Eye: Does not have known diabetic retinopathy.  Neuro/ Feet: He did have  mononeuropathy  (left foot drop)- but this resolved by 2021  Renal: Patient does not have known baseline CKD. He  Does NOT need to be on an ACEI/ARB at present, as his renal function, MA/Cr ratio and BP are all normal    3) Dyslipdemia :   -Lipid panel at goal  Continue Atorvastatin  20 mg daily     F/U in 6 months    Signed electronically by: Stefano Redgie Butts, MD  [facility] Endocrin[facility]  [facility] [address] [place] [place], [place] [address] Phone: [phone] FAX: [phone]   CC: Aisha Harvey, MD [address] [place] [address] Phone: [phone]  Fax: [phone]  Return to Endocrinology clinic as below: No future appointments.

## 2023-05-04 NOTE — Patient Instructions (Signed)
°-   Metformin 2 tablet daily with Breakfast and 2 tablets with Supper  ° ° ° ° °HOW TO TREAT LOW BLOOD SUGARS (Blood sugar LESS THAN 70 MG/DL) °Please follow the RULE OF 15 for the treatment of hypoglycemia treatment (when your (blood sugars are less than 70 mg/dL)  ° °STEP 1: Take 15 grams of carbohydrates when your blood sugar is low, which includes:  °3-4 GLUCOSE TABS  OR °3-4 OZ OF JUICE OR REGULAR SODA OR °ONE TUBE OF GLUCOSE GEL   ° °STEP 2: RECHECK blood sugar in 15 MINUTES °STEP 3: If your blood sugar is still low at the 15 minute recheck --> then, go back to STEP 1 and treat AGAIN with another 15 grams of carbohydrates. ° ° °

## 2023-05-05 LAB — COMPREHENSIVE METABOLIC PANEL
AG Ratio: 2 (calc) (ref 1.0–2.5)
ALT: 14 U/L (ref 9–46)
AST: 13 U/L (ref 10–35)
Albumin: 4.7 g/dL (ref 3.6–5.1)
Alkaline phosphatase (APISO): 44 U/L (ref 35–144)
BUN: 21 mg/dL (ref 7–25)
CO2: 26 mmol/L (ref 20–32)
Calcium: 9.3 mg/dL (ref 8.6–10.3)
Chloride: 107 mmol/L (ref 98–110)
Creat: 0.99 mg/dL (ref 0.70–1.35)
Globulin: 2.3 g/dL (ref 1.9–3.7)
Glucose, Bld: 148 mg/dL — ABNORMAL HIGH (ref 65–99)
Potassium: 4.5 mmol/L (ref 3.5–5.3)
Sodium: 139 mmol/L (ref 135–146)
Total Bilirubin: 0.9 mg/dL (ref 0.2–1.2)
Total Protein: 7 g/dL (ref 6.1–8.1)

## 2023-05-05 LAB — LIPID PANEL
Cholesterol: 136 mg/dL (ref <200)
HDL: 54 mg/dL (ref >=40)
LDL Cholesterol (Calc): 65 mg/dL
Non-HDL Cholesterol (Calc): 82 mg/dL (ref <130)
Total CHOL/HDL Ratio: 2.5 (calc) (ref <5.0)
Triglycerides: 83 mg/dL (ref <150)

## 2023-05-05 LAB — MICROALBUMIN / CREATININE URINE RATIO
Creatinine, Urine: 212 mg/dL (ref 20–320)
Microalb Creat Ratio: 2 mg/g{creat} (ref <30)
Microalb, Ur: 0.5 mg/dL

## 2023-05-05 LAB — T4, FREE: Free T4: 1.2 ng/dL (ref 0.8–1.8)

## 2023-05-05 LAB — TSH: TSH: 1.61 m[IU]/L (ref 0.40–4.50)

## 2023-06-14 ENCOUNTER — Other Ambulatory Visit: Payer: Self-pay

## 2023-06-14 MED ORDER — FREESTYLE LIBRE 3 PLUS SENSOR MISC: 3 refills | Status: DC

## 2023-06-14 MED ORDER — FREESTYLE LIBRE 3 READER DEVI: 0 refills | Status: DC

## 2023-08-31 ENCOUNTER — Encounter: Payer: Self-pay | Admitting: Internal Medicine

## 2023-08-31 ENCOUNTER — Ambulatory Visit (INDEPENDENT_AMBULATORY_CARE_PROVIDER_SITE_OTHER): Payer: Medicare Other | Admitting: Internal Medicine

## 2023-08-31 VITALS — BP 130/80 | HR 74 | Ht 72.0 in | Wt 184.0 lb

## 2023-08-31 DIAGNOSIS — E119 Type 2 diabetes mellitus without complications: Secondary | ICD-10-CM

## 2023-08-31 DIAGNOSIS — Z7984 Long term (current) use of oral hypoglycemic drugs: Secondary | ICD-10-CM | POA: Diagnosis not present

## 2023-08-31 LAB — POCT GLYCOSYLATED HEMOGLOBIN (HGB A1C): Hemoglobin A1C: 6.4 % — AB (ref 4.0–5.6)

## 2023-08-31 MED ORDER — ATORVASTATIN CALCIUM 20 MG PO TABS: 20.0000 mg | ORAL_TABLET | Freq: Every day | ORAL | 3 refills | Status: DC

## 2023-08-31 MED ORDER — METFORMIN HCL ER 500 MG PO TB24: 1000.0000 mg | ORAL_TABLET | Freq: Two times a day (BID) | ORAL | 3 refills | Status: DC

## 2023-08-31 NOTE — Progress Notes (Signed)
 Name: Gary Stewart  Age/ Sex: 68 y.o., male   MRN/ DOB: [id number], [birth year]     PCP: Helyn Lobstein, MD   Reason for Endocrinology Evaluation: Type 2 Diabetes Mellitus  Initial Endocrine Consultative Visit: 06/13/2019    PATIENT IDENTIFIER: Gary Stewart is a 68 y.o. male with a past medical history of sarcoma S/P resection, left foot drop and DM. The patient has followed with Endocrinology clinic since 06/13/2019 for consultative assistance with management of his diabetes.    DIABETIC HISTORY:  Gary Stewart was diagnosed with DM in 04/2019. Pt presented with symptomatic hyperglycemia with a serum glucose of 1076 mg/dL, with low Co2 at 19 mmol/L, elevated BHB 6.78 mmol/L and an elevated AG at 20, and an A1c > 18.5%. He was started on IV insulin  and subsequently transitioned to MDI regimen.  He has not been seen by a physician in 3 years.   His GAD-65 and Islet cell antibody were negative    Stopped prandial insulin  and started Metformin  08/2019 Stopped basal insulin  06/2020 SUBJECTIVE:   During the last visit (05/04/2023): A1c 7.5 %     Today (08/31/2023): Gary Stewart is here for a follow up on diabetes management.  He checks his blood sugars multiple  times daily, through the CGM.  The patient has not had hypoglycemic episodes  Denies nausea, vomiting Denies constipation or diarrhea    HOME DIABETES REGIMEN:  Metformin  500 mg 2 tabs BID Atorvastatin  20 mg daily      Statin: yes ACE-I/ARB: no    METER DOWNLOAD SUMMARY:  CONTINUOUS GLUCOSE MONITORING RECORD INTERPRETATION    Dates of Recording: 4/30-5/13/2025 Sensor description:Freestyle libre  Results statistics:   CGM use % of time 96  Average and SD 146/17.2  Time in range 96 %  % Time Above 180 9  % Time above 250 0  % Time Below target 0     Glycemic patterns summary: BGs have been fluctuating throughout the day and night  Hyperglycemic episodes  postprandial   Hypoglycemic episodes  occurred N/A  Overnight periods: Variable, but he recently have been optimal     DIABETIC COMPLICATIONS: Microvascular complications:    Denies: CKD, retinopathy, neuropathy  Last eye exam: Completed 2024   Macrovascular complications:    Denies: CAD, PVD, CVA   HISTORY:  Past Medical History:  Past Medical History:  Diagnosis Date   Diabetes (HCC)    Hyperlipidemia    Wears glasses    Past Surgical History:  Past Surgical History:  Procedure Laterality Date   EPIGASTRIC HERNIA REPAIR N/A 07/10/2014   Procedure: INCARCERATED EPIGASTRIC HERNIA REPAIR WITH MESH;  Surgeon: Oralee Billow, MD;  Location: [facility];  Service: General;  Laterality: N/A;   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 He has never used smokeless tobacco. He reports current alcohol use. He reports that he does not use drugs. Family History:  Family History  Problem Relation Age of Onset   Parkinson's disease Father    Diabetes Brother      HOME MEDICATIONS: Allergies as of 08/31/2023       Reactions   Penicillins Rash   Did it involve swelling of the face/tongue/throat, SOB, or low BP? No Did it involve sudden or severe rash/hives, skin peeling, or any reaction on the inside of your mouth or nose? Yes Did you need to seek  medical attention at a hospital or doctor's office? Yes When did it last happen?      2000 If all above answers are "NO", may proceed with cephalosporin use.        Medication List        Accurate as of Aug 31, 2023 10:14 AM. If you have any questions, ask your nurse or doctor.          atorvastatin  20 MG tablet Commonly known as: LIPITOR Take 1 tablet (20 mg total) by mouth daily.   blood glucose meter kit and supplies Dispense based on patient and insurance preference. Use up to four times daily as directed. (FOR ICD-10 E10.9, E11.9).   cholecalciferol 25 MCG (1000  UNIT) tablet Commonly known as: VITAMIN D3 Take 1,000 Units by mouth daily.   FreeStyle Libre 3 Reader [employer] Use to monitor blood sugar continuous   FreeStyle Libre 3 Sensor Misc USE AS DIRECTED, CHANGE EVERY 14 DAYS   FreeStyle Libre 3 Plus Sensor Misc Change sensor every 15 days.   metFORMIN  500 MG 24 hr tablet Commonly known as: GLUCOPHAGE -XR Take 2 tablets (1,000 mg total) by mouth 2 (two) times daily with a meal.   OneTouch Delica Plus Lancet30G Misc USE TO CHECK BLOOD SUGAR EVERY DAY   sildenafil 20 MG tablet Commonly known as: REVATIO Take 20 mg by mouth daily as needed.   tadalafil 20 MG tablet Commonly known as: CIALIS Take 20 mg by mouth daily as needed.         OBJECTIVE:   Vital Signs: BP 130/80 (BP Location: Left Arm, Patient Position: Sitting, Cuff Size: Normal)   Pulse 74   Ht 6' (1.829 m)   Wt 184 lb (83.5 kg)   SpO2 98%   BMI 24.95 kg/m   Wt Readings from Last 3 Encounters:  08/31/23 184 lb (83.5 kg)  05/04/23 187 lb (84.8 kg)  10/27/22 189 lb (85.7 kg)     Exam: General: Pt appears well and is in NAD  Lungs: Clear with good BS bilat   Heart: RRR   Abdomen:  soft, nontender  Extremities: No pretibial edema.   Neuro: MS is good with appropriate affect, pt is alert and Ox3      DM foot exam: 05/04/2023 The skin of the feet is intact without sores or ulcerations. The pedal pulses are 2+ on right and 2+ on left. The sensation is intact to a screening 5.07, 10 gram monofilament bilaterally    DATA REVIEWED:  Lab Results  Component Value Date   HGBA1C 6.4 (A) 08/31/2023   HGBA1C 7.5 (A) 05/04/2023   HGBA1C 6.8 (A) 10/27/2022     Latest Reference Range & Units 05/04/23 10:37  Sodium 135 - 146 mmol/L 139  Potassium 3.5 - 5.3 mmol/L 4.5  Chloride 98 - 110 mmol/L 107  CO2 20 - 32 mmol/L 26  Glucose 65 - 99 mg/dL 629 (H)  BUN 7 - 25 mg/dL 21  Creatinine 5.28 - 4.13 mg/dL 2.44  Calcium  8.6 - 10.3 mg/dL 9.3  BUN/Creatinine Ratio 6 -  22 (calc) SEE NOTE:  AG Ratio 1.0 - 2.5 (calc) 2.0  AST 10 - 35 U/L 13  ALT 9 - 46 U/L 14  Total Protein 6.1 - 8.1 g/dL 7.0  Total Bilirubin 0.2 - 1.2 mg/dL 0.9  Total CHOL/HDL Ratio <5.0 (calc) 2.5  Cholesterol <200 mg/dL 010  HDL Cholesterol > OR = 40 mg/dL 54  LDL Cholesterol (Calc) mg/dL (calc) 65  MICROALB/CREAT  RATIO <30 mg/g creat 2  Non-HDL Cholesterol (Calc) <130 mg/dL (calc) 82  Triglycerides <150 mg/dL 83  Alkaline phosphatase (APISO) 35 - 144 U/L 44  Globulin 1.9 - 3.7 g/dL (calc) 2.3  TSH 1.61 - 4.50 mIU/L 1.61  T4,Free(Direct) 0.8 - 1.8 ng/dL 1.2  Albumin MSPROF 3.6 - 5.1 g/dL 4.7  Microalb, Ur mg/dL 0.5  Creatinine, Urine 20 - 320 mg/dL 096     Results for Stewart, Gary Stewart (MRN [id number]) as of 09/12/2019 12:39  Ref. Range 06/13/2019 14:17  Glutamic Acid Decarb Ab Latest Ref Range: <5 IU/mL <5   Results for Gary Stewart, Gary Stewart (MRN [id number]) as of 09/12/2019 12:39  Ref. Range 06/13/2019 14:17  ISLET CELL ANTIBODY SCREEN Latest Ref Range: NEGATIVE  NEGATIVE   04/04/2021  Glucose 130 mg/dL  C-peptide 2.13 ng/mL  ASSESSMENT / PLAN / RECOMMENDATIONS:   1) Type 2 Diabetes Mellitus, Optimally controlled, Without complications - Most recent A1c of 6.4%. Goal A1c < 7.0 %.    -A1c has optimized with dietary changes - GAD-65 and Islet Ab levels are negative his A1c is at goal but the patient has been noted with postprandial hyperglycemia >200 mg/dL, most of his fasting BG's are above goal of 70-1 30 mg/DL - C- Peptide is detecale at 3.61 with a serum glucose of 130 mg/dL 11/6576  - NOT a candidate for SGLT-2 inhibitors due to Hx of DKA  - No changes    MEDICATIONS: - Continue  Metformin  500 mg , 2 tablets BID   EDUCATION / INSTRUCTIONS: BG monitoring instructions: Patient is instructed to check his blood sugars 3 times a day Call [facility] if: BG persistently < 70 I reviewed the Rule of 15 for the treatment of hypoglycemia in detail with the  patient. Literature supplied.    2) Diabetic complications:  Eye: Does not have known diabetic retinopathy.  Neuro/ Feet: He did have  mononeuropathy  (left foot drop)- but this resolved by 2021  Renal: Patient does not have known baseline CKD. He  Does NOT need to be on an ACEI/ARB at present, as his renal function, MA/Cr ratio and BP are all normal    3) Dyslipdemia :   -Lipid panel at goal  Continue Atorvastatin  20 mg daily     F/U in 6 months    Signed electronically by: Natale Bail, MD  [facility]  [facility] [address] [place] [place], [place] [address] Phone: [phone] FAX: [phone]   CC: Helyn Lobstein, MD [address] [place] [address] Phone: [phone]  Fax: [phone]  Return to Endocrinology clinic as below: No future appointments.

## 2023-08-31 NOTE — Patient Instructions (Addendum)
-   Continue Metformin  500 mg, 2 tablets with Breakfast and 2 tablets with Supper      HOW TO TREAT LOW BLOOD SUGARS (Blood sugar LESS THAN 70 MG/DL) Please follow the RULE OF 15 for the treatment of hypoglycemia treatment (when your (blood sugars are less than 70 mg/dL)   STEP 1: Take 15 grams of carbohydrates when your blood sugar is low, which includes:  3-4 GLUCOSE TABS  OR 3-4 OZ OF JUICE OR REGULAR SODA OR ONE TUBE OF GLUCOSE GEL    STEP 2: RECHECK blood sugar in 15 MINUTES STEP 3: If your blood sugar is still low at the 15 minute recheck --> then, go back to STEP 1 and treat AGAIN with another 15 grams of carbohydrates.

## 2024-03-02 ENCOUNTER — Encounter: Payer: Self-pay | Admitting: Internal Medicine

## 2024-03-02 ENCOUNTER — Ambulatory Visit: Admitting: Internal Medicine

## 2024-03-02 VITALS — BP 136/86 | Ht 72.0 in | Wt 184.0 lb

## 2024-03-02 DIAGNOSIS — E785 Hyperlipidemia, unspecified: Secondary | ICD-10-CM | POA: Diagnosis not present

## 2024-03-02 DIAGNOSIS — Z7984 Long term (current) use of oral hypoglycemic drugs: Secondary | ICD-10-CM

## 2024-03-02 DIAGNOSIS — E119 Type 2 diabetes mellitus without complications: Secondary | ICD-10-CM

## 2024-03-02 LAB — POCT GLYCOSYLATED HEMOGLOBIN (HGB A1C): Hemoglobin A1C: 7.1 % — AB (ref 4.0–5.6)

## 2024-03-02 MED ORDER — FREESTYLE LIBRE 3 PLUS SENSOR MISC: 3 refills | Status: AC

## 2024-03-02 MED ORDER — ATORVASTATIN CALCIUM 20 MG PO TABS: 20.0000 mg | ORAL_TABLET | Freq: Every day | ORAL | 3 refills | Status: AC

## 2024-03-02 MED ORDER — METFORMIN HCL ER 500 MG PO TB24
1000.0000 mg | ORAL_TABLET | Freq: Two times a day (BID) | ORAL | 3 refills | Status: AC
Start: 2024-03-02 — End: ?

## 2024-03-02 NOTE — Progress Notes (Signed)
 Name: Gary Stewart  Age/ Sex: 68 y.o., male   MRN/ DOB: [id number], Mar 11, 1956     PCP: Aisha Harvey, MD   Reason for Endocrinology Evaluation: Type 2 Diabetes Mellitus  Initial Endocrine Consultative Visit: 06/13/2019    PATIENT IDENTIFIER: [name] is a 68 y.o. male with a past medical history of sarcoma S/P resection, left foot drop and DM. The patient has followed with Endocrinology clinic since 06/13/2019 for consultative assistance with management of his diabetes.    DIABETIC HISTORY:  [name] was diagnosed with DM in 04/2019. Pt presented with symptomatic hyperglycemia with a serum glucose of 1076 mg/dL, with low Co2 at 19 mmol/L, elevated BHB 6.78 mmol/L and an elevated AG at 20, and an A1c > 18.5%. He was started on IV insulin  and subsequently transitioned to MDI regimen.  He has not been seen by a physician in 3 years.   His GAD-65 and Islet cell antibody were negative    Stopped prandial insulin  and started Metformin  08/2019 Stopped basal insulin  06/2020 SUBJECTIVE:   During the last visit (08/31/2023): A1c 6.4 %     Today (03/02/2024): Gary Stewart is here for a follow up on diabetes management.  He checks his blood sugars multiple  times daily, through the CGM.  The patient has not had hypoglycemic episodes  Weight remains stable No nausea or vomiting  No constipation  No feet tingling or numbness  He exercises 3 x a week  HOME DIABETES REGIMEN:  Metformin  500 mg 2 tabs BID Atorvastatin  20 mg daily      Statin: yes ACE-I/ARB: no    METER DOWNLOAD SUMMARY:  CONTINUOUS GLUCOSE MONITORING RECORD INTERPRETATION    Dates of Recording: 10/30-11/03/2024 Sensor description:Freestyle libre  Results statistics:   CGM use % of time 95  Average and SD 177/17.3  Time in range 59%  % Time Above 180 39  % Time above 250 2  % Time Below target 0     Glycemic patterns summary: BGs are at the upper limit of normal throughout the night  and between the meals  Hyperglycemic episodes  postprandial   Hypoglycemic episodes occurred N/A  Overnight periods: Upper limit of normal     DIABETIC COMPLICATIONS: Microvascular complications:    Denies: CKD, retinopathy, neuropathy  Last eye exam: Completed 10/26/2023   Macrovascular complications:    Denies: CAD, PVD, CVA   HISTORY:  Past Medical History:  Past Medical History:  Diagnosis Date   Diabetes (HCC)    Hyperlipidemia    Wears glasses    Past Surgical History:  Past Surgical History:  Procedure Laterality Date   EPIGASTRIC HERNIA REPAIR N/A 07/10/2014   Procedure: INCARCERATED EPIGASTRIC HERNIA REPAIR WITH MESH;  Surgeon: Krystal Spinner, MD;  Location: [facility];  Service: General;  Laterality: N/A;   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 He has never used smokeless tobacco. He reports current alcohol use. He reports that he does not use drugs. Family History:  Family History  Problem Relation Age of Onset   Parkinson's disease Father    Diabetes Brother      HOME MEDICATIONS: Allergies as of 03/02/2024       Reactions   Penicillins Rash   Did it involve swelling of the face/tongue/throat, SOB, or low BP? No Did it involve sudden or severe rash/hives, skin peeling, or  any reaction on the inside of your mouth or nose? Yes Did you need to seek medical attention at a hospital or doctor's office? Yes When did it last happen?      2000 If all above answers are "NO", may proceed with cephalosporin use.        Medication List        Accurate as of March 02, 2024 10:09 AM. If you have any questions, ask your nurse or doctor.          atorvastatin  20 MG tablet Commonly known as: LIPITOR Take 1 tablet (20 mg total) by mouth daily.   blood glucose meter kit and supplies Dispense based on patient and insurance preference. Use up to four times daily  as directed. (FOR ICD-10 E10.9, E11.9).   cholecalciferol 25 MCG (1000 UNIT) tablet Commonly known as: VITAMIN D3 Take 1,000 Units by mouth daily.   FreeStyle Libre 3 Reader [employer] Use to monitor blood sugar continuous   FreeStyle Libre 3 Sensor Misc USE AS DIRECTED, CHANGE EVERY 14 DAYS   FreeStyle Libre 3 Plus Sensor Misc Change sensor every 15 days.   metFORMIN  500 MG 24 hr tablet Commonly known as: GLUCOPHAGE -XR Take 2 tablets (1,000 mg total) by mouth 2 (two) times daily with a meal.   OneTouch Delica Plus Lancet30G Misc USE TO CHECK BLOOD SUGAR EVERY DAY   sildenafil 20 MG tablet Commonly known as: REVATIO Take 20 mg by mouth daily as needed.   tadalafil 20 MG tablet Commonly known as: CIALIS Take 20 mg by mouth daily as needed.         OBJECTIVE:   Vital Signs: BP 136/86   Ht 6' (1.829 m)   Wt 184 lb (83.5 kg)   BMI 24.95 kg/m   Wt Readings from Last 3 Encounters:  03/02/24 184 lb (83.5 kg)  08/31/23 184 lb (83.5 kg)  05/04/23 187 lb (84.8 kg)    Exam: General: Pt appears well and is in NAD  Lungs: Clear with good BS bilat   Heart: RRR   Abdomen:  soft, nontender  Extremities: No pretibial edema.   Neuro: MS is good with appropriate affect, pt is alert and Ox3      DM foot exam: 03/02/2024 The skin of the feet is intact without sores or ulcerations. The pedal pulses are 2+ on right and 2+ on left. The sensation is intact to a screening 5.07, 10 gram monofilament bilaterally    DATA REVIEWED:  Lab Results  Component Value Date   HGBA1C 6.4 (A) 08/31/2023   HGBA1C 7.5 (A) 05/04/2023   HGBA1C 6.8 (A) 10/27/2022     Latest Reference Range & Units 05/04/23 10:37  Sodium 135 - 146 mmol/L 139  Potassium 3.5 - 5.3 mmol/L 4.5  Chloride 98 - 110 mmol/L 107  CO2 20 - 32 mmol/L 26  Glucose 65 - 99 mg/dL 851 (H)  BUN 7 - 25 mg/dL 21  Creatinine 9.29 - 8.64 mg/dL 9.00  Calcium  8.6 - 10.3 mg/dL 9.3  BUN/Creatinine Ratio 6 - 22 (calc) SEE NOTE:   AG Ratio 1.0 - 2.5 (calc) 2.0  AST 10 - 35 U/L 13  ALT 9 - 46 U/L 14  Total Protein 6.1 - 8.1 g/dL 7.0  Total Bilirubin 0.2 - 1.2 mg/dL 0.9  Total CHOL/HDL Ratio <5.0 (calc) 2.5  Cholesterol <200 mg/dL 863  HDL Cholesterol > OR = 40 mg/dL 54  LDL Cholesterol (Calc) mg/dL (calc) 65  MICROALB/CREAT RATIO <30 mg/g  creat 2  Non-HDL Cholesterol (Calc) <130 mg/dL (calc) 82  Triglycerides <150 mg/dL 83  Alkaline phosphatase (APISO) 35 - 144 U/L 44  Globulin 1.9 - 3.7 g/dL (calc) 2.3  TSH 9.59 - 4.50 mIU/L 1.61  T4,Free(Direct) 0.8 - 1.8 ng/dL 1.2  Albumin MSPROF 3.6 - 5.1 g/dL 4.7  Microalb, Ur mg/dL 0.5  Creatinine, Urine 20 - 320 mg/dL 787     Results for DARELD, MCAULIFFE (MRN [id number]) as of 09/12/2019 12:39  Ref. Range 06/13/2019 14:17  Glutamic Acid Decarb Ab Latest Ref Range: <5 IU/mL <5   Results for Bok, Juvon R (MRN [id number]) as of 09/12/2019 12:39  Ref. Range 06/13/2019 14:17  ISLET CELL ANTIBODY SCREEN Latest Ref Range: NEGATIVE  NEGATIVE   04/04/2021  Glucose 130 mg/dL  C-peptide 6.38 ng/mL  ASSESSMENT / PLAN / RECOMMENDATIONS:   1) Type 2 Diabetes Mellitus, Optimally controlled, Without complications - Most recent A1c of 7.1%. Goal A1c < 7.0 %.    -A1c has trended up from 6.4% to 7.1% - GAD-65 and Islet Ab levels are negative his A1c is at goal but the patient has been noted with postprandial hyperglycemia >200 mg/dL, most of his fasting BG's are above goal of 70-1 30 mg/DL - C- Peptide is detecale at 3.61 with a serum glucose of 130 mg/dL 87/7977  - NOT a candidate for SGLT-2 inhibitors due to Hx of DKA  - Patient will focus on lifestyle changes, he already exercises on a regular basis, encouraged low-carb diet specially at suppertime    MEDICATIONS: - Continue  Metformin  500 mg , 2 tablets BID   EDUCATION / INSTRUCTIONS: BG monitoring instructions: Patient is instructed to check his blood sugars 3 times a day     2) Diabetic complications:  Eye:  Does not have known diabetic retinopathy.  Neuro/ Feet: He did have  mononeuropathy  (left foot drop)- but this resolved by 2021  Renal: Patient does not have known baseline CKD. He  Does NOT need to be on an ACEI/ARB at present, as his renal function, MA/Cr ratio and BP are all normal    3) Dyslipdemia :   - LDL has been at goal  Medication  Continue Atorvastatin  20 mg daily     F/U in 6 months    Signed electronically by: Gary Stewart Butts, MD  [facility]  [facility] [address] [place] [place], [place] [address] Phone: [phone] FAX: [phone]   CC: Aisha Harvey, MD [address] [place] [address] Phone: [phone]  Fax: [phone]  Return to Endocrinology clinic as below: Future Appointments  Date Time Provider Department Center  03/02/2024 10:10 AM Lisha Vitale, Gary Redgie, MD [id number] None

## 2024-03-02 NOTE — Patient Instructions (Signed)
-   Continue Metformin  500 mg, 2 tablets with Breakfast and 2 tablets with Supper      HOW TO TREAT LOW BLOOD SUGARS (Blood sugar LESS THAN 70 MG/DL) Please follow the RULE OF 15 for the treatment of hypoglycemia treatment (when your (blood sugars are less than 70 mg/dL)   STEP 1: Take 15 grams of carbohydrates when your blood sugar is low, which includes:  3-4 GLUCOSE TABS  OR 3-4 OZ OF JUICE OR REGULAR SODA OR ONE TUBE OF GLUCOSE GEL    STEP 2: RECHECK blood sugar in 15 MINUTES STEP 3: If your blood sugar is still low at the 15 minute recheck --> then, go back to STEP 1 and treat AGAIN with another 15 grams of carbohydrates.

## 2024-08-31 ENCOUNTER — Ambulatory Visit: Admitting: Internal Medicine
